# Patient Record
Sex: Male | Born: 1945 | Race: White | Hispanic: No | Marital: Married | Smoking: Current every day smoker
Health system: Southern US, Community
[De-identification: ages and names within clinical notes are randomized; demographics above are authoritative.]

## PROBLEM LIST (undated history)

## (undated) DIAGNOSIS — K219 Gastro-esophageal reflux disease without esophagitis: Secondary | ICD-10-CM

## (undated) DIAGNOSIS — H409 Unspecified glaucoma: Secondary | ICD-10-CM

## (undated) DIAGNOSIS — J449 Chronic obstructive pulmonary disease, unspecified: Secondary | ICD-10-CM

## (undated) DIAGNOSIS — K922 Gastrointestinal hemorrhage, unspecified: Secondary | ICD-10-CM

---

## 2011-03-24 DIAGNOSIS — H269 Unspecified cataract: Secondary | ICD-10-CM

## 2011-03-24 HISTORY — DX: Unspecified cataract: H26.9

## 2011-07-13 DIAGNOSIS — H524 Presbyopia: Secondary | ICD-10-CM | POA: Diagnosis not present

## 2011-07-13 DIAGNOSIS — H40019 Open angle with borderline findings, low risk, unspecified eye: Secondary | ICD-10-CM | POA: Diagnosis not present

## 2011-07-13 DIAGNOSIS — H2589 Other age-related cataract: Secondary | ICD-10-CM | POA: Diagnosis not present

## 2011-08-13 DIAGNOSIS — IMO0002 Reserved for concepts with insufficient information to code with codable children: Secondary | ICD-10-CM | POA: Diagnosis not present

## 2011-08-13 DIAGNOSIS — H25049 Posterior subcapsular polar age-related cataract, unspecified eye: Secondary | ICD-10-CM | POA: Diagnosis not present

## 2011-08-13 DIAGNOSIS — H2510 Age-related nuclear cataract, unspecified eye: Secondary | ICD-10-CM | POA: Diagnosis not present

## 2011-08-13 DIAGNOSIS — H53469 Homonymous bilateral field defects, unspecified side: Secondary | ICD-10-CM | POA: Diagnosis not present

## 2011-08-24 ENCOUNTER — Encounter (HOSPITAL_COMMUNITY): Payer: Self-pay | Admitting: Pharmacy Technician

## 2011-08-24 DIAGNOSIS — H40019 Open angle with borderline findings, low risk, unspecified eye: Secondary | ICD-10-CM | POA: Diagnosis not present

## 2011-08-24 DIAGNOSIS — H2589 Other age-related cataract: Secondary | ICD-10-CM | POA: Diagnosis not present

## 2011-08-26 ENCOUNTER — Encounter (HOSPITAL_COMMUNITY)
Admission: RE | Admit: 2011-08-26 | Discharge: 2011-08-26 | Disposition: A | Discharge status: Home / Self Care | Payer: Medicare Other | Source: Ambulatory Visit | Attending: Ophthalmology | Admitting: Ophthalmology

## 2011-08-26 ENCOUNTER — Encounter (HOSPITAL_COMMUNITY): Payer: Self-pay

## 2011-08-26 DIAGNOSIS — J449 Chronic obstructive pulmonary disease, unspecified: Secondary | ICD-10-CM | POA: Diagnosis not present

## 2011-08-26 DIAGNOSIS — Z01812 Encounter for preprocedural laboratory examination: Secondary | ICD-10-CM | POA: Diagnosis not present

## 2011-08-26 DIAGNOSIS — H2589 Other age-related cataract: Secondary | ICD-10-CM | POA: Diagnosis not present

## 2011-08-26 DIAGNOSIS — Z01810 Encounter for preprocedural cardiovascular examination: Secondary | ICD-10-CM | POA: Diagnosis not present

## 2011-08-26 HISTORY — DX: Chronic obstructive pulmonary disease, unspecified: J44.9

## 2011-08-26 HISTORY — DX: Unspecified glaucoma: H40.9

## 2011-08-26 LAB — BASIC METABOLIC PANEL
BUN: 5 mg/dL — ABNORMAL LOW (ref 6–23)
Calcium: 9.8 mg/dL (ref 8.4–10.5)
Chloride: 97 mEq/L (ref 96–112)
Creatinine, Ser: 0.87 mg/dL (ref 0.50–1.35)
GFR calc Af Amer: >90 mL/min (ref >90)

## 2011-08-26 NOTE — Patient Instructions (Addendum)
Your procedure is scheduled on: Monday, 08/31/11   Report to [facility] at     0700   AM.  Call this number if you have problems the morning of surgery: [phone]   Remember:   Do not eat or drink   After Midnight.  Take these medicines the morning of surgery with A SIP OF WATER: none   Do not wear jewelry, make-up or nail polish.  Do not wear lotions, powders, or perfumes. You may wear deodorant.  Do not bring valuables to the hospital.  Contacts, dentures or bridgework may not be worn into surgery.     Patients discharged the day of surgery will not be allowed to drive home.  Name and phone number of your driver: family  Special Instructions: Use eye drops as directed.   Please read over the following fact sheets that you were given: Pain Booklet, Anesthesia Post-op Instructions and Care and Recovery After Surgery    Cataract Surgery  A cataract is a clouding of the lens of the eye. When a lens becomes cloudy, vision is reduced based on the degree and nature of the clouding. Surgery may be needed to improve vision. Surgery removes the cloudy lens and usually replaces it with a substitute lens (intraocular lens, IOL). LET YOUR EYE DOCTOR KNOW ABOUT:  Allergies to food or medicine.   Medicines taken including herbs, eyedrops, over-the-counter medicines, and creams.   Use of steroids (by mouth or creams).   Previous problems with anesthetics or numbing medicine.   History of bleeding problems or blood clots.   Previous surgery.   Other health problems, including diabetes and kidney problems.   Possibility of pregnancy, if this applies.  RISKS AND COMPLICATIONS  Infection.   Inflammation of the eyeball (endophthalmitis) that can spread to both eyes (sympathetic ophthalmia).   Poor wound healing.   If an IOL is inserted, it can later fall out of proper position. This is very uncommon.   Clouding of the part of your eye that holds an IOL in place. This is called an  "after-cataract." These are uncommon, but easily treated.  BEFORE THE PROCEDURE  Do not eat or drink anything except small amounts of water for 8 to 12 before your surgery, or as directed by your caregiver.   Unless you are told otherwise, continue any eyedrops you have been prescribed.   Talk to your primary caregiver about all other medicines that you take (both prescription and non-prescription). In some cases, you may need to stop or change medicines near the time of your surgery. This is most important if you are taking blood-thinning medicine.Do not stop medicines unless you are told to do so.   Arrange for someone to drive you to and from the procedure.   Do not put contact lenses in either eye on the day of your surgery.  PROCEDURE There is more than one method for safely removing a cataract. Your doctor can explain the differences and help determine which is best for you. Phacoemulsification surgery is the most common form of cataract surgery.  An injection is given behind the eye or eyedrops are given to make this a painless procedure.   A small cut (incision) is made on the edge of the clear, dome-shaped surface that covers the front of the eye (cornea).   A tiny probe is painlessly inserted into the eye. This device gives off ultrasound waves that soften and break up the cloudy center of the lens. This makes it  easier for the cloudy lens to be removed by suction.   An IOL may be implanted.   The normal lens of the eye is covered by a clear capsule. Part of that capsule is intentionally left in the eye to support the IOL.   Your surgeon may or may not use stitches to close the incision.  There are other forms of cataract surgery that require a larger incision and stiches to close the eye. This approach is taken in cases where the doctor feels that the cataract cannot be easily removed using phacoemulsification. AFTER THE PROCEDURE  When an IOL is implanted, it does not need  care. It becomes a permanent part of your eye and cannot be seen or felt.   Your doctor will schedule follow-up exams to check on your progress.   Review your other medicines with your doctor to see which can be resumed after surgery.   Use eyedrops or take medicine as prescribed by your doctor.  Document Released: 02/26/2011 Document Reviewed: 02/23/2011 [facility] Patient Information 2012 [place], [place].  PATIENT INSTRUCTIONS POST-ANESTHESIA  IMMEDIATELY FOLLOWING SURGERY:  Do not drive or operate machinery for the first twenty four hours after surgery.  Do not make any important decisions for twenty four hours after surgery or while taking narcotic pain medications or sedatives.  If you develop intractable nausea and vomiting or a severe headache please notify your doctor immediately.  FOLLOW-UP:  Please make an appointment with your surgeon as instructed. You do not need to follow up with anesthesia unless specifically instructed to do so.  WOUND CARE INSTRUCTIONS (if applicable):  Keep a dry clean dressing on the anesthesia/puncture wound site if there is drainage.  Once the wound has quit draining you may leave it open to air.  Generally you should leave the bandage intact for twenty four hours unless there is drainage.  If the epidural site drains for more than 36-48 hours please call the anesthesia department.  QUESTIONS?:  Please feel free to call your physician or the hospital operator if you have any questions, and they will be happy to assist you.

## 2011-08-28 MED ORDER — TETRACAINE HCL 0.5 % OP SOLN
OPHTHALMIC | Status: AC
  Filled 2011-08-28: qty 2

## 2011-08-28 MED ORDER — CYCLOPENTOLATE-PHENYLEPHRINE 0.2-1 % OP SOLN
OPHTHALMIC | Status: AC
  Filled 2011-08-28: qty 2

## 2011-08-28 MED ORDER — NEOMYCIN-POLYMYXIN-DEXAMETH 3.5-10000-0.1 OP OINT
TOPICAL_OINTMENT | OPHTHALMIC | Status: AC
  Filled 2011-08-28: qty 3.5

## 2011-08-28 MED ORDER — LIDOCAINE HCL (PF) 1 % IJ SOLN
INTRAMUSCULAR | Status: AC
  Filled 2011-08-28: qty 2

## 2011-08-28 MED ORDER — PHENYLEPHRINE HCL 2.5 % OP SOLN
OPHTHALMIC | Status: AC
  Filled 2011-08-28: qty 2

## 2011-08-28 MED ORDER — LIDOCAINE HCL 3.5 % OP GEL
OPHTHALMIC | Status: AC
  Filled 2011-08-28: qty 5

## 2011-08-28 NOTE — OR Nursing (Signed)
Potassium 5.2 reported to Dr. Jayme Cloud, no further orders.

## 2011-08-31 ENCOUNTER — Ambulatory Visit (HOSPITAL_COMMUNITY)
Admission: RE | Admit: 2011-08-31 | Discharge: 2011-08-31 | Disposition: A | Discharge status: Home / Self Care | Payer: Medicare Other | Source: Ambulatory Visit | Attending: Ophthalmology | Admitting: Ophthalmology

## 2011-08-31 ENCOUNTER — Encounter (HOSPITAL_COMMUNITY): Payer: Self-pay | Admitting: *Deleted

## 2011-08-31 ENCOUNTER — Encounter (HOSPITAL_COMMUNITY): Payer: Self-pay | Admitting: Anesthesiology

## 2011-08-31 ENCOUNTER — Ambulatory Visit (HOSPITAL_COMMUNITY): Payer: Medicare Other | Admitting: Anesthesiology

## 2011-08-31 ENCOUNTER — Encounter (HOSPITAL_COMMUNITY)
Admission: RE | Disposition: A | Discharge status: Home / Self Care | Payer: Self-pay | Source: Ambulatory Visit | Attending: Ophthalmology

## 2011-08-31 DIAGNOSIS — J449 Chronic obstructive pulmonary disease, unspecified: Secondary | ICD-10-CM | POA: Diagnosis not present

## 2011-08-31 DIAGNOSIS — Z01810 Encounter for preprocedural cardiovascular examination: Secondary | ICD-10-CM | POA: Insufficient documentation

## 2011-08-31 DIAGNOSIS — H269 Unspecified cataract: Secondary | ICD-10-CM | POA: Diagnosis not present

## 2011-08-31 DIAGNOSIS — H2589 Other age-related cataract: Secondary | ICD-10-CM | POA: Diagnosis not present

## 2011-08-31 DIAGNOSIS — Z01812 Encounter for preprocedural laboratory examination: Secondary | ICD-10-CM | POA: Insufficient documentation

## 2011-08-31 DIAGNOSIS — J4489 Other specified chronic obstructive pulmonary disease: Secondary | ICD-10-CM | POA: Insufficient documentation

## 2011-08-31 HISTORY — PX: CATARACT EXTRACTION W/PHACO: SHX586

## 2011-08-31 SURGERY — PHACOEMULSIFICATION, CATARACT, WITH IOL INSERTION
Anesthesia: Monitor Anesthesia Care | Site: Eye | Laterality: Left | Wound class: Clean

## 2011-08-31 MED ORDER — TETRACAINE HCL 0.5 % OP SOLN
1.0000 [drp] | OPHTHALMIC | Status: AC
  Administered 2011-08-31 (×3): 1 [drp] via OPHTHALMIC

## 2011-08-31 MED ORDER — NEOMYCIN-POLYMYXIN-DEXAMETH 0.1 % OP OINT
TOPICAL_OINTMENT | OPHTHALMIC | Status: DC | PRN
  Administered 2011-08-31: 1 via OPHTHALMIC

## 2011-08-31 MED ORDER — LIDOCAINE HCL (PF) 1 % IJ SOLN
INTRAMUSCULAR | Status: DC | PRN
  Administered 2011-08-31: .6 mL

## 2011-08-31 MED ORDER — MIDAZOLAM HCL 2 MG/2ML IJ SOLN
INTRAMUSCULAR | Status: AC
  Filled 2011-08-31: qty 2

## 2011-08-31 MED ORDER — EPINEPHRINE HCL 1 MG/ML IJ SOLN
INTRAMUSCULAR | Status: AC
  Filled 2011-08-31: qty 1

## 2011-08-31 MED ORDER — BSS IO SOLN
INTRAOCULAR | Status: DC | PRN
  Administered 2011-08-31: 15 mL via INTRAOCULAR

## 2011-08-31 MED ORDER — POVIDONE-IODINE 5 % OP SOLN
OPHTHALMIC | Status: DC | PRN
  Administered 2011-08-31: 1 via OPHTHALMIC

## 2011-08-31 MED ORDER — PHENYLEPHRINE HCL 2.5 % OP SOLN
1.0000 [drp] | OPHTHALMIC | Status: AC
  Administered 2011-08-31 (×3): 1 [drp] via OPHTHALMIC

## 2011-08-31 MED ORDER — LACTATED RINGERS IV SOLN
INTRAVENOUS | Status: DC
  Administered 2011-08-31: 08:00:00 via INTRAVENOUS

## 2011-08-31 MED ORDER — MIDAZOLAM HCL 2 MG/2ML IJ SOLN
1.0000 mg | INTRAMUSCULAR | Status: DC | PRN
Start: 2011-08-31 — End: 2011-08-31
  Administered 2011-08-31: 2 mg via INTRAVENOUS

## 2011-08-31 MED ORDER — CYCLOPENTOLATE-PHENYLEPHRINE 0.2-1 % OP SOLN
1.0000 [drp] | OPHTHALMIC | Status: AC
  Administered 2011-08-31 (×3): 1 [drp] via OPHTHALMIC

## 2011-08-31 MED ORDER — LIDOCAINE 3.5 % OP GEL OPTIME - NO CHARGE
OPHTHALMIC | Status: DC | PRN
  Administered 2011-08-31: 1 [drp] via OPHTHALMIC

## 2011-08-31 MED ORDER — LIDOCAINE HCL 3.5 % OP GEL: 1.0000 "application " | Freq: Once | OPHTHALMIC | Status: DC

## 2011-08-31 MED ORDER — EPINEPHRINE HCL 1 MG/ML IJ SOLN
INTRAOCULAR | Status: DC | PRN
  Administered 2011-08-31: 08:00:00

## 2011-08-31 MED ORDER — PROVISC 10 MG/ML IO SOLN
INTRAOCULAR | Status: DC | PRN
  Administered 2011-08-31: 8.5 mg via INTRAOCULAR

## 2011-08-31 SURGICAL SUPPLY — 32 items

## 2011-08-31 NOTE — Discharge Instructions (Signed)
Thomas Davenport  08/31/2011     Instructions  1. Use medications as Instructed.  Shake well before use. Wait 5 minutes between drops.  2. Do not rub the operative eye. Do not swim underwater for 2 weeks.  3. You may remove the clear shield and resume your normal activities the day after  Surgery. Your eyes may feel more comfortable if you wear dark glasses outside.  4. Call our office at [phone] if you have sudden change in vision, extreme redness or pain. Some fluctuation in vision is normal after surgery. If you have an emergency after hours, call Dr. Alto Denver at [phone].  5. It is important that you attend all of your follow-up appointments.        Follow-up:{follow up:32580} with Gemma Payor, MD.   Dr. Lahoma Crocker: [phone]  Dr. Lita Mains: [phone]  Dr. Alto Denver: [phone]   If you find that you cannot contact your physician, but feel that your signs and   Symptoms warrant a physician's attention, call the Emergency Room at   [phone].    PATIENT INSTRUCTIONS POST-ANESTHESIA  IMMEDIATELY FOLLOWING SURGERY:  Do not drive or operate machinery for the first twenty four hours after surgery.  Do not make any important decisions for twenty four hours after surgery or while taking narcotic pain medications or sedatives.  If you develop intractable nausea and vomiting or a severe headache please notify your doctor immediately.  FOLLOW-UP:  Please make an appointment with your surgeon as instructed. You do not need to follow up with anesthesia unless specifically instructed to do so.  WOUND CARE INSTRUCTIONS (if applicable):  Keep a dry clean dressing on the anesthesia/puncture wound site if there is drainage.  Once the wound has quit draining you may leave it open to air.  Generally you should leave the bandage intact for twenty four hours unless there is drainage.  If the epidural site drains for more than 36-48 hours please call the anesthesia department.  QUESTIONS?:  Please  feel free to call your physician or the hospital operator if you have any questions, and they will be happy to assist you.

## 2011-08-31 NOTE — Op Note (Signed)
NAMESTIRLING, ORTON                ACCOUNT NO.:  [id number]  MEDICAL RECORD NO.:  [id number]  LOCATION:  [facility]                          FACILITY:  [facility]  PHYSICIAN:  Susanne Greenhouse, MD       DATE OF BIRTH:  [birth year]  DATE OF PROCEDURE:  08/31/2011 DATE OF DISCHARGE:  08/31/2011                              OPERATIVE REPORT   PREOPERATIVE DIAGNOSIS:  Combined cataract, left eye, diagnosis code 366.19.  POSTOPERATIVE DIAGNOSIS:  Combined cataract, left eye, diagnosis code 366.19.  OPERATION PERFORMED:  Phacoemulsification with posterior chamber intraocular lens implantation, left eye.  SURGEON:  Bonne Dolores. Gretna Bergin, MD  ANESTHESIA:  Topical wit IV sedation  OPERATIVE SUMMARY:  In the preoperative area, dilating drops were placed into the left eye.  The patient was then brought into the operating room where he was placed under general anesthesia.  The eye was then prepped and draped.  Beginning with a 75 blade, a paracentesis port was made at the surgeon's 2 o'clock position.  The anterior chamber was then filled with a 1% nonpreserved lidocaine solution with epinephrine.  This was followed by Viscoat to deepen the chamber.  A small fornix-based peritomy was performed superiorly.  Next, a single iris hook was placed through the limbus superiorly.  A 2.4-mm keratome blade was then used to make a clear corneal incision over the iris hook.  A bent cystotome needle and Utrata forceps were used to create a continuous tear capsulotomy.  Hydrodissection was performed using balanced salt solution on a fine cannula.  The lens nucleus was then removed using phacoemulsification in a quadrant cracking technique.  The cortical material was then removed with irrigation and aspiration.  The capsular bag and anterior chamber were refilled with Provisc.  The wound was widened to approximately 3 mm and a posterior chamber intraocular lens was placed into the capsular bag without difficulty using an  [employer] lens injecting system.  A single 10-0 nylon suture was then used to close the incision as well as stromal hydration.  The Provisc was removed from the anterior chamber and capsular bag with irrigation and aspiration.  At this point, the wounds were tested for leak, which were negative.  The anterior chamber remained deep and stable.  The patient tolerated the procedure well.  There were no operative complications, and he awoke from general anesthesia without problem.  No surgical specimens.  Prosthetic device used is a Lenstec posterior chamber lens, model Softec HD, power of 23.5, serial number is [id number].          ______________________________ Susanne Greenhouse, MD     KEH/MEDQ  D:  08/31/2011  T:  08/31/2011  Job:  [id number]

## 2011-08-31 NOTE — Brief Op Note (Signed)
Pre-Op Dx: Cataract OS Post-Op Dx: Cataract OS Surgeon: Yaelis Scharfenberg Anesthesia: Topical with MAC Surgery: Cataract Extraction with Intraocular lens Implant OS Implant: Lenstec, Model Softec HD Specimen: None Complications: None 

## 2011-08-31 NOTE — Transfer of Care (Signed)
Immediate Anesthesia Transfer of Care Note  Patient: Thomas Davenport  Procedure(s) Performed: Procedure(s) (LRB): CATARACT EXTRACTION PHACO AND INTRAOCULAR LENS PLACEMENT (IOC) (Left)  Patient Location: Shortstay  Anesthesia Type: MAC  Level of Consciousness: awake  Airway & Oxygen Therapy: Patient Spontanous Breathing   Post-op Assessment: Report given to PACU RN, Post -op Vital signs reviewed and stable and Patient moving all extremities  Post vital signs: Reviewed and stable  Complications: No apparent anesthesia complications

## 2011-08-31 NOTE — Anesthesia Procedure Notes (Signed)
Procedure Name: MAC Date/Time: 08/31/2011 8:23 AM Performed by: Franco Nones Pre-anesthesia Checklist: Patient identified, Emergency Drugs available, Suction available, Timeout performed and Patient being monitored Patient Re-evaluated:Patient Re-evaluated prior to inductionOxygen Delivery Method: Nasal Cannula

## 2011-08-31 NOTE — H&P (Signed)
I have reviewed the H&P, the patient was re-examined, and I have identified no interval changes in medical condition and plan of care since the history and physical of record  

## 2011-08-31 NOTE — Anesthesia Postprocedure Evaluation (Signed)
  Anesthesia Post-op Note  Patient: Thomas Davenport  Procedure(s) Performed: Procedure(s) (LRB): CATARACT EXTRACTION PHACO AND INTRAOCULAR LENS PLACEMENT (IOC) (Left)  Patient Location:  Short Stay  Anesthesia Type: MAC  Level of Consciousness: awake  Airway and Oxygen Therapy: Patient Spontanous Breathing  Post-op Pain: none  Post-op Assessment: Post-op Vital signs reviewed, Patient's Cardiovascular Status Stable, Respiratory Function Stable, Patent Airway, No signs of Nausea or vomiting and Pain level controlled  Post-op Vital Signs: Reviewed and stable  Complications: No apparent anesthesia complications

## 2011-08-31 NOTE — Anesthesia Preprocedure Evaluation (Signed)
Anesthesia Evaluation  Patient identified by MRN, date of birth, ID band Patient awake    Reviewed: Allergy & Precautions, H&P , NPO status , Patient's Chart, lab work & pertinent test results  Airway Mallampati: I      Dental  (+) Edentulous Upper and Edentulous Lower   Pulmonary COPD breath sounds clear to auscultation        Cardiovascular negative cardio ROS  Rhythm:Regular Rate:Normal     Neuro/Psych    GI/Hepatic negative GI ROS,   Endo/Other    Renal/GU      Musculoskeletal   Abdominal   Peds  Hematology   Anesthesia Other Findings   Reproductive/Obstetrics                           Anesthesia Physical Anesthesia Plan  ASA: II  Anesthesia Plan: MAC   Post-op Pain Management:    Induction: Intravenous  Airway Management Planned: Nasal Cannula  Additional Equipment:   Intra-op Plan:   Post-operative Plan:   Informed Consent: I have reviewed the patients History and Physical, chart, labs and discussed the procedure including the risks, benefits and alternatives for the proposed anesthesia with the patient or authorized representative who has indicated his/her understanding and acceptance.     Plan Discussed with:   Anesthesia Plan Comments:         Anesthesia Quick Evaluation  

## 2011-09-01 ENCOUNTER — Encounter (HOSPITAL_COMMUNITY): Payer: Self-pay | Admitting: Ophthalmology

## 2011-09-07 DIAGNOSIS — H2589 Other age-related cataract: Secondary | ICD-10-CM | POA: Diagnosis not present

## 2011-09-07 DIAGNOSIS — H5231 Anisometropia: Secondary | ICD-10-CM | POA: Diagnosis not present

## 2011-09-08 ENCOUNTER — Encounter (HOSPITAL_COMMUNITY)
Admission: RE | Admit: 2011-09-08 | Discharge: 2011-09-08 | Payer: Medicare Other | Source: Ambulatory Visit | Attending: Ophthalmology | Admitting: Ophthalmology

## 2011-09-08 ENCOUNTER — Encounter (HOSPITAL_COMMUNITY): Payer: Self-pay

## 2011-09-11 MED ORDER — CYCLOPENTOLATE-PHENYLEPHRINE 0.2-1 % OP SOLN
OPHTHALMIC | Status: AC
  Filled 2011-09-11: qty 2

## 2011-09-11 MED ORDER — LIDOCAINE HCL 3.5 % OP GEL
OPHTHALMIC | Status: AC
  Filled 2011-09-11: qty 5

## 2011-09-11 MED ORDER — NEOMYCIN-POLYMYXIN-DEXAMETH 3.5-10000-0.1 OP OINT
TOPICAL_OINTMENT | OPHTHALMIC | Status: AC
  Filled 2011-09-11: qty 3.5

## 2011-09-11 MED ORDER — LIDOCAINE HCL (PF) 1 % IJ SOLN
INTRAMUSCULAR | Status: AC
  Filled 2011-09-11: qty 2

## 2011-09-11 MED ORDER — TETRACAINE HCL 0.5 % OP SOLN
OPHTHALMIC | Status: AC
  Filled 2011-09-11: qty 2

## 2011-09-14 ENCOUNTER — Encounter (HOSPITAL_COMMUNITY): Payer: Self-pay | Admitting: Anesthesiology

## 2011-09-14 ENCOUNTER — Ambulatory Visit (HOSPITAL_COMMUNITY): Payer: Medicare Other | Admitting: Anesthesiology

## 2011-09-14 ENCOUNTER — Encounter (HOSPITAL_COMMUNITY)
Admission: RE | Disposition: A | Discharge status: Home / Self Care | Payer: Self-pay | Source: Ambulatory Visit | Attending: Ophthalmology

## 2011-09-14 ENCOUNTER — Encounter (HOSPITAL_COMMUNITY): Payer: Self-pay | Admitting: *Deleted

## 2011-09-14 ENCOUNTER — Ambulatory Visit (HOSPITAL_COMMUNITY)
Admission: RE | Admit: 2011-09-14 | Discharge: 2011-09-14 | Disposition: A | Discharge status: Home / Self Care | Payer: Medicare Other | Source: Ambulatory Visit | Attending: Ophthalmology | Admitting: Ophthalmology

## 2011-09-14 DIAGNOSIS — J4489 Other specified chronic obstructive pulmonary disease: Secondary | ICD-10-CM | POA: Insufficient documentation

## 2011-09-14 DIAGNOSIS — H2589 Other age-related cataract: Secondary | ICD-10-CM | POA: Insufficient documentation

## 2011-09-14 DIAGNOSIS — J449 Chronic obstructive pulmonary disease, unspecified: Secondary | ICD-10-CM | POA: Diagnosis not present

## 2011-09-14 DIAGNOSIS — H269 Unspecified cataract: Secondary | ICD-10-CM | POA: Diagnosis not present

## 2011-09-14 HISTORY — PX: CATARACT EXTRACTION W/PHACO: SHX586

## 2011-09-14 SURGERY — PHACOEMULSIFICATION, CATARACT, WITH IOL INSERTION
Anesthesia: Monitor Anesthesia Care | Site: Eye | Laterality: Right | Wound class: Clean

## 2011-09-14 MED ORDER — POVIDONE-IODINE 5 % OP SOLN
OPHTHALMIC | Status: DC | PRN
  Administered 2011-09-14: 1 via OPHTHALMIC

## 2011-09-14 MED ORDER — EPINEPHRINE HCL 1 MG/ML IJ SOLN
INTRAOCULAR | Status: DC | PRN
  Administered 2011-09-14: 12:00:00

## 2011-09-14 MED ORDER — LIDOCAINE HCL 3.5 % OP GEL: 1.0000 "application " | Freq: Once | OPHTHALMIC | Status: DC

## 2011-09-14 MED ORDER — TETRACAINE HCL 0.5 % OP SOLN
OPHTHALMIC | Status: AC
  Filled 2011-09-14: qty 2

## 2011-09-14 MED ORDER — PHENYLEPHRINE HCL 2.5 % OP SOLN
1.0000 [drp] | OPHTHALMIC | Status: AC
  Administered 2011-09-14: 1 [drp] via OPHTHALMIC
  Administered 2011-09-14: 12:00:00 via OPHTHALMIC
  Administered 2011-09-14: 1 [drp] via OPHTHALMIC

## 2011-09-14 MED ORDER — TETRACAINE HCL 0.5 % OP SOLN
1.0000 [drp] | OPHTHALMIC | Status: AC
  Administered 2011-09-14: 1 [drp] via OPHTHALMIC
  Administered 2011-09-14: 12:00:00 via OPHTHALMIC
  Administered 2011-09-14: 1 [drp] via OPHTHALMIC

## 2011-09-14 MED ORDER — EPINEPHRINE HCL 1 MG/ML IJ SOLN
INTRAMUSCULAR | Status: AC
  Filled 2011-09-14: qty 1

## 2011-09-14 MED ORDER — LACTATED RINGERS IV SOLN
INTRAVENOUS | Status: DC
  Administered 2011-09-14: 1000 mL via INTRAVENOUS

## 2011-09-14 MED ORDER — NEOMYCIN-POLYMYXIN-DEXAMETH 0.1 % OP OINT
TOPICAL_OINTMENT | OPHTHALMIC | Status: DC | PRN
  Administered 2011-09-14: 1 via OPHTHALMIC

## 2011-09-14 MED ORDER — CYCLOPENTOLATE-PHENYLEPHRINE 0.2-1 % OP SOLN
1.0000 [drp] | OPHTHALMIC | Status: AC
  Administered 2011-09-14 (×3): 1 [drp] via OPHTHALMIC

## 2011-09-14 MED ORDER — MIDAZOLAM HCL 2 MG/2ML IJ SOLN
INTRAMUSCULAR | Status: AC
  Administered 2011-09-14: 2 mg via INTRAVENOUS
  Filled 2011-09-14: qty 2

## 2011-09-14 MED ORDER — CYCLOPENTOLATE-PHENYLEPHRINE 0.2-1 % OP SOLN: 1.0000 [drp] | OPHTHALMIC | Status: AC

## 2011-09-14 MED ORDER — MIDAZOLAM HCL 2 MG/2ML IJ SOLN
INTRAMUSCULAR | Status: AC
  Filled 2011-09-14: qty 2

## 2011-09-14 MED ORDER — MIDAZOLAM HCL 2 MG/2ML IJ SOLN
1.0000 mg | INTRAMUSCULAR | Status: DC | PRN
  Administered 2011-09-14: 2 mg via INTRAVENOUS

## 2011-09-14 MED ORDER — BSS IO SOLN
INTRAOCULAR | Status: DC | PRN
  Administered 2011-09-14: 15 mL via INTRAOCULAR

## 2011-09-14 MED ORDER — PHENYLEPHRINE HCL 2.5 % OP SOLN
OPHTHALMIC | Status: AC
  Filled 2011-09-14: qty 2

## 2011-09-14 MED ORDER — PROVISC 10 MG/ML IO SOLN
INTRAOCULAR | Status: DC | PRN
  Administered 2011-09-14: 8.5 mg via INTRAOCULAR

## 2011-09-14 MED ORDER — LIDOCAINE 3.5 % OP GEL OPTIME - NO CHARGE
OPHTHALMIC | Status: DC | PRN
  Administered 2011-09-14: 1 [drp] via OPHTHALMIC

## 2011-09-14 MED ORDER — LIDOCAINE HCL (PF) 1 % IJ SOLN
INTRAMUSCULAR | Status: DC | PRN
  Administered 2011-09-14: .5 mL

## 2011-09-14 SURGICAL SUPPLY — 32 items

## 2011-09-14 NOTE — Op Note (Signed)
Thomas Davenport, Thomas Davenport                ACCOUNT NO.:  [id number]  MEDICAL RECORD NO.:  [id number]  LOCATION:  [facility]                          FACILITY:  [facility]  PHYSICIAN:  Susanne Greenhouse, MD       DATE OF BIRTH:  1945-10-14  DATE OF PROCEDURE:  09/14/2011 DATE OF DISCHARGE:  09/14/2011                              OPERATIVE REPORT   PREOPERATIVE DIAGNOSIS:  Combined cataract, right eye, diagnosis code 366.19.  POSTOPERATIVE DIAGNOSIS:  Combined cataract, right eye, diagnosis code 366.19.  OPERATION PERFORMED:  Phacoemulsification with posterior chamber intraocular lens implantation, right eye.  SURGEON:  Bonne Dolores. Clora Ohmer, MD  ANESTHESIA:  Topical with IV sedation.  OPERATIVE SUMMARY:  In the preoperative area, dilating drops were placed into the right eye.  The patient was then brought into the operating room where he was placed under general anesthesia.  The eye was then prepped and draped.  Beginning with a 75 blade, a paracentesis port was made at the surgeon's 2 o'clock position.  The anterior chamber was then filled with a 1% nonpreserved lidocaine solution with epinephrine.  This was followed by Viscoat to deepen the chamber.  A small fornix-based peritomy was performed superiorly.  Next, a single iris hook was placed through the limbus superiorly.  A 2.4-mm keratome blade was then used to make a clear corneal incision over the iris hook.  A bent cystotome needle and Utrata forceps were used to create a continuous tear capsulotomy.  Hydrodissection was performed using balanced salt solution on a fine cannula.  The lens nucleus was then removed using phacoemulsification in a quadrant cracking technique.  The cortical material was then removed with irrigation and aspiration.  The capsular bag and anterior chamber were refilled with Provisc.  The wound was widened to approximately 3 mm and a posterior chamber intraocular lens was placed into the capsular bag without difficulty using an  [employer] lens injecting system.  A single 10-0 nylon suture was then used to close the incision as well as stromal hydration.  The Provisc was removed from the anterior chamber and capsular bag with irrigation and aspiration.  At this point, the wounds were tested for leak, which were negative.  The anterior chamber remained deep and stable.  The patient tolerated the procedure well.  There were no operative complications, and he awoke from general anesthesia without problem.  Torque intraocular lens was oriented at the 179 and 359 degrees meridians.  No surgical specimens. Prosthetic device used is a Lenstec posterior chamber lens, model Softec HD, power of 22.25, serial number is [id number].          ______________________________ Susanne Greenhouse, MD     KEH/MEDQ  D:  09/14/2011  T:  09/14/2011  Job:  [id number]

## 2011-09-14 NOTE — H&P (Signed)
I have reviewed the H&P, the patient was re-examined, and I have identified no interval changes in medical condition and plan of care since the history and physical of record  

## 2011-09-14 NOTE — Anesthesia Procedure Notes (Signed)
Procedure Name: MAC Date/Time: 09/14/2011 12:23 PM Performed by: Franco Nones Pre-anesthesia Checklist: Patient identified, Emergency Drugs available, Suction available, Timeout performed and Patient being monitored Patient Re-evaluated:Patient Re-evaluated prior to inductionOxygen Delivery Method: Nasal Cannula

## 2011-09-14 NOTE — Discharge Instructions (Signed)
Thomas Davenport  09/14/2011     Instructions  1. Use medications as Instructed.  Shake well before use. Wait 5 minutes between drops       .  2. Do not rub the operative eye. Do not swim underwater for 2 weeks.  3. You may remove the clear shield and resume your normal activities the day after  Surgery. Your eyes may feel more comfortable if you wear dark glasses outside.  4. Call our office at [phone] if you have sudden change in vision, extreme redness or pain. Some fluctuation in vision is normal after surgery. If you have an emergency after hours, call Dr. Alto Denver at [phone].  5. It is important that you attend all of your follow-up appointments.        Follow-up:{follow up:32580} with Gemma Payor, MD.   Dr. Lahoma Crocker: [phone]  Dr. Lita Mains: [phone]  Dr. Alto Denver: [phone]   If you find that you cannot contact your physician, but feel that your signs and   Symptoms warrant a physician's attention, call the Emergency Room at   [phone].   Other{NA AND [id number]}.

## 2011-09-14 NOTE — Brief Op Note (Signed)
Pre-Op Dx: Cataract OD Post-Op Dx: Cataract OD Surgeon: Zyron Deeley Anesthesia: Topical with MAC Surgery: Cataract Extraction with Intraocular lens Implant OD Implant: Lenstec, Model Softec HD Blood Loss: None Specimen: None Complications: None 

## 2011-09-14 NOTE — Transfer of Care (Signed)
Immediate Anesthesia Transfer of Care Note  Patient: Thomas Davenport  Procedure(s) Performed: Procedure(s) (LRB): CATARACT EXTRACTION PHACO AND INTRAOCULAR LENS PLACEMENT (IOC) (Right)  Patient Location: PACU and Short Stay  Anesthesia Type: MAC  Level of Consciousness: awake, alert  and oriented  Airway & Oxygen Therapy: Patient Spontanous Breathing  Post-op Assessment: Report given to PACU RN  Post vital signs: Reviewed and stable  Complications: No apparent anesthesia complications

## 2011-09-14 NOTE — Anesthesia Preprocedure Evaluation (Signed)
Anesthesia Evaluation  Patient identified by MRN, date of birth, ID band Patient awake    Reviewed: Allergy & Precautions, H&P , NPO status , Patient's Chart, lab work & pertinent test results  Airway Mallampati: I      Dental  (+) Edentulous Upper and Edentulous Lower   Pulmonary COPD breath sounds clear to auscultation        Cardiovascular negative cardio ROS  Rhythm:Regular Rate:Normal     Neuro/Psych    GI/Hepatic negative GI ROS,   Endo/Other    Renal/GU      Musculoskeletal   Abdominal   Peds  Hematology   Anesthesia Other Findings   Reproductive/Obstetrics                           Anesthesia Physical Anesthesia Plan  ASA: II  Anesthesia Plan: MAC   Post-op Pain Management:    Induction: Intravenous  Airway Management Planned: Nasal Cannula  Additional Equipment:   Intra-op Plan:   Post-operative Plan:   Informed Consent: I have reviewed the patients History and Physical, chart, labs and discussed the procedure including the risks, benefits and alternatives for the proposed anesthesia with the patient or authorized representative who has indicated his/her understanding and acceptance.     Plan Discussed with:   Anesthesia Plan Comments:         Anesthesia Quick Evaluation

## 2011-09-14 NOTE — Anesthesia Postprocedure Evaluation (Signed)
  Anesthesia Post-op Note  Patient: Thomas Davenport  Procedure(s) Performed: Procedure(s) (LRB): CATARACT EXTRACTION PHACO AND INTRAOCULAR LENS PLACEMENT (IOC) (Right)  Patient Location: PACU and Short Stay  Anesthesia Type: MAC  Level of Consciousness: awake, alert  and oriented  Airway and Oxygen Therapy: Patient Spontanous Breathing  Post-op Pain: none  Post-op Assessment: Post-op Vital signs reviewed, Patient's Cardiovascular Status Stable, Respiratory Function Stable, Patent Airway and No signs of Nausea or vomiting  Post-op Vital Signs: Reviewed and stable  Complications: No apparent anesthesia complications

## 2011-09-15 ENCOUNTER — Encounter (HOSPITAL_COMMUNITY): Payer: Self-pay | Admitting: Ophthalmology

## 2012-05-16 DIAGNOSIS — H40019 Open angle with borderline findings, low risk, unspecified eye: Secondary | ICD-10-CM | POA: Diagnosis not present

## 2012-05-16 DIAGNOSIS — H524 Presbyopia: Secondary | ICD-10-CM | POA: Diagnosis not present

## 2013-10-09 ENCOUNTER — Telehealth: Payer: Self-pay | Admitting: Family Medicine

## 2013-10-09 NOTE — Telephone Encounter (Signed)
appt scheduled for 29 with miller

## 2013-10-18 ENCOUNTER — Ambulatory Visit (INDEPENDENT_AMBULATORY_CARE_PROVIDER_SITE_OTHER): Payer: Medicare Other | Admitting: Family Medicine

## 2013-10-18 ENCOUNTER — Encounter: Payer: Self-pay | Admitting: Family Medicine

## 2013-10-18 VITALS — BP 142/72 | HR 92 | Temp 97.8°F | Ht 72.0 in | Wt 175.0 lb

## 2013-10-18 DIAGNOSIS — Z0000 Encounter for general adult medical examination without abnormal findings: Secondary | ICD-10-CM | POA: Diagnosis not present

## 2013-10-18 DIAGNOSIS — K1370 Unspecified lesions of oral mucosa: Secondary | ICD-10-CM

## 2013-10-18 MED ORDER — CHLORHEXIDINE GLUCONATE 0.12 % MT SOLN: 15.0000 mL | Freq: Two times a day (BID) | OROMUCOSAL | Status: DC

## 2013-10-18 NOTE — Progress Notes (Signed)
   Subjective:    Patient ID: Thomas Davenport, male    DOB: 04/25/1945, 68 y.o.   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 After not being seen there for 3 years he was discharged from the practice. He is surprisingly healthy. His only complaint today is of a ulcer or sore on his right lower gum. He is a smoker but denies any shortness of breath chronic cough frequent bronchitis or pneumonia. He is on no medications at all.    Review of Systems  Constitutional: Negative.   HENT: Mouth sores: 1st occurrence.   Eyes: Negative.   Respiratory: Negative.   Cardiovascular: Negative.   Endocrine: Negative.   Genitourinary: Negative.   Neurological: Negative.   Psychiatric/Behavioral: Negative.        Objective:   Physical Exam  Vitals reviewed. Constitutional: He is oriented to person, place, and time. He appears well-developed and well-nourished.  HENT:  Head: Normocephalic.  Right Ear: External ear normal.  Left Ear: External ear normal.  Nose: Nose normal.  Mouth/Throat: Oropharynx is clear and moist.  Ulcer on right lower gum No adenopathy or other lesions  Eyes: Conjunctivae and EOM are normal. Pupils are equal, round, and reactive to light.  Neck: Normal range of motion. Neck supple.  Cardiovascular: Normal rate, regular rhythm, normal heart sounds and intact distal pulses.   Pulmonary/Chest: Effort normal and breath sounds normal.  Abdominal: Soft. Bowel sounds are normal.  Musculoskeletal: Normal range of motion.  Neurological: He is alert and oriented to person, place, and time.  Skin: Skin is warm and dry.  Psychiatric: He has a normal mood and affect. His behavior is normal. Judgment and thought content normal.          Assessment & Plan:  Apthous ulcer.  Will treat with peridex.  If not resolved in 1-2 weeks, get oral surgery consult Pt appears surprisingly healthy for 68; on no meds.   He is a smoker and it is unlikely that there are not occult diseases. Wardell Honour MD

## 2013-10-19 ENCOUNTER — Other Ambulatory Visit: Payer: Medicare Other

## 2013-10-20 ENCOUNTER — Telehealth: Payer: Self-pay | Admitting: Family Medicine

## 2013-10-21 NOTE — Addendum Note (Signed)
Addended by: Ilean China on: 10/21/2013 11:15 AM   Modules accepted: Orders

## 2013-10-23 DIAGNOSIS — E785 Hyperlipidemia, unspecified: Secondary | ICD-10-CM | POA: Diagnosis not present

## 2013-10-23 DIAGNOSIS — Z0000 Encounter for general adult medical examination without abnormal findings: Secondary | ICD-10-CM | POA: Diagnosis not present

## 2013-10-23 NOTE — Addendum Note (Signed)
Addended by: Selmer Dominion on: 10/23/2013 09:49 AM   Modules accepted: Orders

## 2013-10-23 NOTE — Addendum Note (Signed)
Addended by: Selmer Dominion on: 10/23/2013 09:48 AM   Modules accepted: Orders

## 2013-10-24 LAB — CMP14+EGFR
ALBUMIN: 3.8 g/dL (ref 3.5–4.8)
ALK PHOS: 129 IU/L — AB (ref 39–117)
ALT: 9 IU/L (ref 0–44)
AST: 20 IU/L (ref 0–40)
Albumin/Globulin Ratio: 1.7 (ref 1.1–2.5)
BILIRUBIN TOTAL: 0.5 mg/dL (ref 0.0–1.2)
BUN / CREAT RATIO: 5 — AB (ref 10–22)
BUN: 5 mg/dL — AB (ref 8–27)
CO2: 22 mmol/L (ref 18–29)
CREATININE: 1.02 mg/dL (ref 0.76–1.27)
Calcium: 9.2 mg/dL (ref 8.6–10.2)
Chloride: 97 mmol/L (ref 97–108)
GFR calc non Af Amer: 74 mL/min/{1.73_m2} (ref >59)
GFR, EST AFRICAN AMERICAN: 86 mL/min/{1.73_m2} (ref >59)
GLOBULIN, TOTAL: 2.3 g/dL (ref 1.5–4.5)
Glucose: 85 mg/dL (ref 65–99)
Potassium: 5.2 mmol/L (ref 3.5–5.2)
Sodium: 135 mmol/L (ref 134–144)
Total Protein: 6.1 g/dL (ref 6.0–8.5)

## 2013-10-24 LAB — LIPID PANEL
CHOL/HDL RATIO: 3 ratio (ref 0.0–5.0)
Cholesterol, Total: 160 mg/dL (ref 100–199)
HDL: 54 mg/dL (ref >39)
LDL CALC: 87 mg/dL (ref 0–99)
TRIGLYCERIDES: 93 mg/dL (ref 0–149)
VLDL Cholesterol Cal: 19 mg/dL (ref 5–40)

## 2014-02-19 ENCOUNTER — Telehealth: Payer: Self-pay | Admitting: Family Medicine

## 2014-02-19 NOTE — Telephone Encounter (Signed)
Aware, needs to be seen.

## 2014-03-06 ENCOUNTER — Encounter (HOSPITAL_COMMUNITY): Payer: Self-pay | Admitting: Emergency Medicine

## 2014-03-06 ENCOUNTER — Inpatient Hospital Stay (HOSPITAL_COMMUNITY)
Admission: EM | Admit: 2014-03-06 | Discharge: 2014-03-08 | DRG: 897 | Disposition: A | Discharge status: Home Health | Payer: Medicare Other | Attending: Internal Medicine | Admitting: Internal Medicine

## 2014-03-06 ENCOUNTER — Emergency Department (HOSPITAL_COMMUNITY): Payer: Medicare Other

## 2014-03-06 DIAGNOSIS — Y92009 Unspecified place in unspecified non-institutional (private) residence as the place of occurrence of the external cause: Secondary | ICD-10-CM

## 2014-03-06 DIAGNOSIS — W19XXXA Unspecified fall, initial encounter: Secondary | ICD-10-CM | POA: Diagnosis present

## 2014-03-06 DIAGNOSIS — S299XXA Unspecified injury of thorax, initial encounter: Secondary | ICD-10-CM | POA: Diagnosis not present

## 2014-03-06 DIAGNOSIS — D72829 Elevated white blood cell count, unspecified: Secondary | ICD-10-CM | POA: Diagnosis present

## 2014-03-06 DIAGNOSIS — S32019A Unspecified fracture of first lumbar vertebra, initial encounter for closed fracture: Secondary | ICD-10-CM | POA: Diagnosis not present

## 2014-03-06 DIAGNOSIS — M545 Low back pain: Secondary | ICD-10-CM | POA: Diagnosis present

## 2014-03-06 DIAGNOSIS — R55 Syncope and collapse: Secondary | ICD-10-CM | POA: Diagnosis not present

## 2014-03-06 DIAGNOSIS — J449 Chronic obstructive pulmonary disease, unspecified: Secondary | ICD-10-CM | POA: Diagnosis present

## 2014-03-06 DIAGNOSIS — H409 Unspecified glaucoma: Secondary | ICD-10-CM | POA: Diagnosis not present

## 2014-03-06 DIAGNOSIS — Z720 Tobacco use: Secondary | ICD-10-CM | POA: Diagnosis present

## 2014-03-06 DIAGNOSIS — R42 Dizziness and giddiness: Secondary | ICD-10-CM | POA: Diagnosis not present

## 2014-03-06 DIAGNOSIS — S300XXA Contusion of lower back and pelvis, initial encounter: Secondary | ICD-10-CM | POA: Diagnosis not present

## 2014-03-06 DIAGNOSIS — W1830XA Fall on same level, unspecified, initial encounter: Secondary | ICD-10-CM | POA: Diagnosis present

## 2014-03-06 DIAGNOSIS — M549 Dorsalgia, unspecified: Secondary | ICD-10-CM | POA: Diagnosis present

## 2014-03-06 DIAGNOSIS — F1010 Alcohol abuse, uncomplicated: Principal | ICD-10-CM | POA: Diagnosis present

## 2014-03-06 DIAGNOSIS — F17210 Nicotine dependence, cigarettes, uncomplicated: Secondary | ICD-10-CM | POA: Diagnosis present

## 2014-03-06 DIAGNOSIS — Y901 Blood alcohol level of 20-39 mg/100 ml: Secondary | ICD-10-CM | POA: Diagnosis present

## 2014-03-06 DIAGNOSIS — S3992XA Unspecified injury of lower back, initial encounter: Secondary | ICD-10-CM | POA: Diagnosis not present

## 2014-03-06 DIAGNOSIS — E871 Hypo-osmolality and hyponatremia: Secondary | ICD-10-CM | POA: Diagnosis present

## 2014-03-06 DIAGNOSIS — R404 Transient alteration of awareness: Secondary | ICD-10-CM | POA: Diagnosis not present

## 2014-03-06 DIAGNOSIS — M4856XA Collapsed vertebra, not elsewhere classified, lumbar region, initial encounter for fracture: Secondary | ICD-10-CM | POA: Diagnosis present

## 2014-03-06 DIAGNOSIS — S32010A Wedge compression fracture of first lumbar vertebra, initial encounter for closed fracture: Secondary | ICD-10-CM

## 2014-03-06 LAB — COMPREHENSIVE METABOLIC PANEL
ALBUMIN: 3 g/dL — AB (ref 3.5–5.2)
ALT: 12 U/L (ref 0–53)
AST: 27 U/L (ref 0–37)
Alkaline Phosphatase: 114 U/L (ref 39–117)
Anion gap: 16 — ABNORMAL HIGH (ref 5–15)
BUN: 4 mg/dL — ABNORMAL LOW (ref 6–23)
CALCIUM: 9 mg/dL (ref 8.4–10.5)
CO2: 17 mEq/L — ABNORMAL LOW (ref 19–32)
CREATININE: 0.71 mg/dL (ref 0.50–1.35)
Chloride: 96 mEq/L (ref 96–112)
GFR calc Af Amer: >90 mL/min (ref >90)
GFR calc non Af Amer: >90 mL/min (ref >90)
Glucose, Bld: 87 mg/dL (ref 70–99)
Potassium: 4.2 mEq/L (ref 3.7–5.3)
Sodium: 129 mEq/L — ABNORMAL LOW (ref 137–147)
Total Bilirubin: 0.4 mg/dL (ref 0.3–1.2)
Total Protein: 6.6 g/dL (ref 6.0–8.3)

## 2014-03-06 LAB — URINALYSIS, ROUTINE W REFLEX MICROSCOPIC
Bilirubin Urine: NEGATIVE
GLUCOSE, UA: NEGATIVE mg/dL
Hgb urine dipstick: NEGATIVE
Ketones, ur: NEGATIVE mg/dL
LEUKOCYTES UA: NEGATIVE
NITRITE: NEGATIVE
PH: 6 (ref 5.0–8.0)
Protein, ur: NEGATIVE mg/dL
SPECIFIC GRAVITY, URINE: 1.003 — AB (ref 1.005–1.030)
Urobilinogen, UA: 0.2 mg/dL (ref 0.0–1.0)

## 2014-03-06 LAB — CBC WITH DIFFERENTIAL/PLATELET
BASOS ABS: 0.1 10*3/uL (ref 0.0–0.1)
BASOS PCT: 1 % (ref 0–1)
EOS PCT: 1 % (ref 0–5)
Eosinophils Absolute: 0.1 10*3/uL (ref 0.0–0.7)
HCT: 44 % (ref 39.0–52.0)
Hemoglobin: 15.4 g/dL (ref 13.0–17.0)
LYMPHS PCT: 10 % — AB (ref 12–46)
Lymphs Abs: 1.2 10*3/uL (ref 0.7–4.0)
MCH: 37.5 pg — ABNORMAL HIGH (ref 26.0–34.0)
MCHC: 35 g/dL (ref 30.0–36.0)
MCV: 107.1 fL — AB (ref 78.0–100.0)
MONO ABS: 1.3 10*3/uL — AB (ref 0.1–1.0)
Monocytes Relative: 11 % (ref 3–12)
NEUTROS ABS: 9.1 10*3/uL — AB (ref 1.7–7.7)
Neutrophils Relative %: 77 % (ref 43–77)
PLATELETS: 177 10*3/uL (ref 150–400)
RBC: 4.11 MIL/uL — ABNORMAL LOW (ref 4.22–5.81)
RDW: 17 % — AB (ref 11.5–15.5)
WBC: 11.8 10*3/uL — AB (ref 4.0–10.5)

## 2014-03-06 LAB — TROPONIN I

## 2014-03-06 LAB — ETHANOL: ALCOHOL ETHYL (B): 26 mg/dL — AB (ref 0–11)

## 2014-03-06 MED ORDER — HYDROMORPHONE HCL 1 MG/ML IJ SOLN
1.0000 mg | Freq: Once | INTRAMUSCULAR | Status: AC
  Administered 2014-03-06: 1 mg via INTRAVENOUS
  Filled 2014-03-06: qty 1

## 2014-03-06 MED ORDER — SODIUM CHLORIDE 0.9 % IV BOLUS (SEPSIS)
500.0000 mL | Freq: Once | INTRAVENOUS | Status: AC
  Administered 2014-03-06: 500 mL via INTRAVENOUS

## 2014-03-06 NOTE — ED Provider Notes (Signed)
CSN: [id number]     Arrival date & time 03/06/14  2053 History   First MD Initiated Contact with Patient 03/06/14 2149     Chief Complaint  Patient presents with  . Fall  . Back Pain     (Consider location/radiation/quality/duration/timing/severity/associated sxs/prior Treatment) Patient is a 68 y.o. male presenting with fall and back pain.  Fall This is a new problem. The current episode started 1 to 2 hours ago. Episode frequency: once. The problem has been resolved. Pertinent negatives include no chest pain, no abdominal pain, no headaches and no shortness of breath. The symptoms are aggravated by standing. Nothing relieves the symptoms. He has tried nothing for the symptoms. The treatment provided no relief.  Back Pain Location:  Lumbar spine Quality:  Aching Radiates to:  Does not radiate Pain severity:  Moderate Pain is:  Same all the time Onset quality:  Sudden Duration:  1 hour Timing:  Constant Progression:  Unchanged Chronicity:  New Context comment:  After a fall Relieved by:  None tried Worsened by:  Nothing tried Ineffective treatments:  None tried Associated symptoms: no abdominal pain, no chest pain, no dysuria, no fever, no headaches and no numbness     Past Medical History  Diagnosis Date  . COPD (chronic obstructive pulmonary disease)   . Glaucoma     left eyes surgical correction 2013   Past Surgical History  Procedure Laterality Date  . Cataract extraction w/phaco  08/31/2011    Procedure: CATARACT EXTRACTION PHACO AND INTRAOCULAR LENS PLACEMENT (IOC);  Surgeon: Tonny Branch, MD;  Location: [facility];  Service: Ophthalmology;  Laterality: Left;  CDE:22.56  . Cataract extraction w/phaco  09/14/2011    Procedure: CATARACT EXTRACTION PHACO AND INTRAOCULAR LENS PLACEMENT (IOC);  Surgeon: Tonny Branch, MD;  Location: [facility];  Service: Ophthalmology;  Laterality: Right;  CDE:24.60    Family History  Problem Relation Age of Onset  . Tuberculosis Father   . Cancer  Brother     lung  . Heart attack Brother 59   History  Substance Use Topics  . Smoking status: Current Every Day Smoker -- 1.50 packs/day for 45 years    Types: Cigarettes  . Smokeless tobacco: Never Used  . Alcohol Use: 42.0 oz/week    70 Cans of beer per week     Comment: about 10 cans of beer a day    Review of Systems  Constitutional: Negative for fever.  HENT: Negative for drooling and rhinorrhea.   Eyes: Negative for pain.  Respiratory: Negative for cough and shortness of breath.   Cardiovascular: Negative for chest pain and leg swelling.  Gastrointestinal: Negative for nausea, vomiting, abdominal pain and diarrhea.  Genitourinary: Negative for dysuria and hematuria.  Musculoskeletal: Positive for back pain. Negative for gait problem and neck pain.  Skin: Negative for color change.  Neurological: Positive for syncope. Negative for numbness and headaches.  Hematological: Negative for adenopathy.  Psychiatric/Behavioral: Negative for behavioral problems.  All other systems reviewed and are negative.     Allergies  Pineapple  Home Medications   Prior to Admission medications   Medication Sig Start Date End Date Taking? Authorizing Provider  chlorhexidine (PERIDEX) 0.12 % solution Use as directed 15 mLs in the mouth or throat 2 (two) times daily. 10/18/13   Wardell Honour, MD  fexofenadine (ALLEGRA) 180 MG tablet Take 180 mg by mouth daily.    Historical Provider, MD   BP 135/58 mmHg  Pulse 89  Temp(Src) 98 F (36.7  C) (Oral)  Resp 21  SpO2 97% Physical Exam  Constitutional: He is oriented to person, place, and time. He appears well-developed and well-nourished.  HENT:  Head: Normocephalic and atraumatic.  Right Ear: External ear normal.  Left Ear: External ear normal.  Nose: Nose normal.  Mouth/Throat: Oropharynx is clear and moist. No oropharyngeal exudate.  Eyes: Conjunctivae and EOM are normal. Pupils are equal, round, and reactive to light.  Neck:  Normal range of motion. Neck supple.  Cardiovascular: Normal rate, regular rhythm, normal heart sounds and intact distal pulses.  Exam reveals no gallop and no friction rub.   No murmur heard. Pulmonary/Chest: Effort normal and breath sounds normal. No respiratory distress. He has no wheezes.  Abdominal: Soft. Bowel sounds are normal. He exhibits no distension. There is no tenderness. There is no rebound and no guarding.  Musculoskeletal: Normal range of motion. He exhibits tenderness. He exhibits no edema.  Mild tenderness to palpation of the midline lower lumbar spine.  Mild skin tear to left medial forearm.  Neurological: He is alert and oriented to person, place, and time.  alert, oriented x3 speech: normal in context and clarity memory: intact grossly cranial nerves II-XII: intact motor strength: full proximally and distally no involuntary movements or tremors sensation: intact to light touch diffusely  cerebellar: finger-to-nose and heel-to-shin intact Gait: initially deferred at request of pt d/t back pain, upon re-examination pt has difficulty gaining his balance   Skin: Skin is warm and dry.  Psychiatric: He has a normal mood and affect. His behavior is normal.  Nursing note and vitals reviewed.   ED Course  Procedures (including critical care time) Labs Review Labs Reviewed  CBC WITH DIFFERENTIAL - Abnormal; Notable for the following:    WBC 11.8 (*)    RBC 4.11 (*)    MCV 107.1 (*)    MCH 37.5 (*)    RDW 17.0 (*)    Neutro Abs 9.1 (*)    Lymphocytes Relative 10 (*)    Monocytes Absolute 1.3 (*)    All other components within normal limits  COMPREHENSIVE METABOLIC PANEL - Abnormal; Notable for the following:    Sodium 129 (*)    CO2 17 (*)    BUN 4 (*)    Albumin 3.0 (*)    Anion gap 16 (*)    All other components within normal limits  URINALYSIS, ROUTINE W REFLEX MICROSCOPIC - Abnormal; Notable for the following:    Specific Gravity, Urine 1.003 (*)    All  other components within normal limits  ETHANOL - Abnormal; Notable for the following:    Alcohol, Ethyl (B) 26 (*)    All other components within normal limits  URINE RAPID DRUG SCREEN (HOSP PERFORMED) - Abnormal; Notable for the following:    Opiates POSITIVE (*)    All other components within normal limits  COMPREHENSIVE METABOLIC PANEL - Abnormal; Notable for the following:    Glucose, Bld 109 (*)    BUN 3 (*)    Albumin 3.0 (*)    GFR calc non Af Amer 89 (*)    All other components within normal limits  CBC - Abnormal; Notable for the following:    RBC 3.97 (*)    MCV 106.8 (*)    MCH 36.8 (*)    RDW 16.9 (*)    All other components within normal limits  CBC - Abnormal; Notable for the following:    RBC 4.00 (*)    MCV 107.3 (*)  MCH 36.5 (*)    RDW 16.9 (*)    All other components within normal limits  BASIC METABOLIC PANEL - Abnormal; Notable for the following:    Sodium 132 (*)    BUN 3 (*)    GFR calc non Af Amer 85 (*)    All other components within normal limits  TROPONIN I  TSH  LIPID PANEL  TROPONIN I  TROPONIN I  TROPONIN I  PROTIME-INR  VITAMIN B12    Imaging Review Dg Chest 2 View  03/07/2014   CLINICAL DATA:  Fall.  EXAM: CHEST  2 VIEW  COMPARISON:  None.  FINDINGS: Normal heart size and mediastinal contours. No acute infiltrate or edema. No effusion or pneumothorax. No evidence of acute fracture. There are calcified pleural plaques bilaterally, most confluent over the diaphragm. The lungs are mildly hyperinflated.  IMPRESSION: 1. No evidence of intrathoracic injury or fracture. 2. Bilateral calcified pleural plaques compatible with asbestos exposure.   Electronically Signed   By: Jorje Guild M.D.   On: 03/07/2014 00:16   Dg Lumbar Spine Complete  03/07/2014   CLINICAL DATA:  Low back pain after fall.  Initial encounter  EXAM: LUMBAR SPINE - COMPLETE 4+ VIEW  COMPARISON:  None.  FINDINGS: L1 compression fracture involving the superior endplate,  predominantly left-sided. Height loss is mild (25% or less).  No traumatic malalignment.  There is diffuse spondylotic endplate spurring and disc narrowing. No notable facet degenerative change.  Extensive arterial calcification. Bilateral calcified pleural plaques.  IMPRESSION: Age-indeterminate L1 compression fracture with mild height loss.   Electronically Signed   By: Jorje Guild M.D.   On: 03/07/2014 00:18   Ct Head Wo Contrast  03/07/2014   CLINICAL DATA:  mechanical fall 03-06-14, pt.states he hit his lt. Side of his head  EXAM: CT HEAD WITHOUT CONTRAST  TECHNIQUE: Contiguous axial images were obtained from the base of the skull through the vertex without intravenous contrast.  COMPARISON:  None.  FINDINGS: Atherosclerotic and physiologic intracranial calcifications. Moderate diffuse parenchymal atrophy. Patchy Mild areas of hypoattenuation in deep and periventricular white matter bilaterally. Negative for acute intracranial hemorrhage, mass lesion, acute infarction, midline shift, or mass-effect. Acute infarct may be inapparent on noncontrast CT. Ventricles and sulci symmetric. Bone windows demonstrate no focal lesion.  IMPRESSION: 1. Negative for bleed or other acute intracranial process. 2. Atrophy and mild nonspecific white matter changes as above.   Electronically Signed   By: Arne Cleveland M.D.   On: 03/07/2014 09:06     EKG Interpretation   Date/Time:  Tuesday March 06 2014 22:11:43 EST Ventricular Rate:  88 PR Interval:  148 QRS Duration: 102 QT Interval:  376 QTC Calculation: 455 R Axis:   81 Text Interpretation:  Sinus rhythm Borderline right axis deviation No  significant change since last tracing Confirmed by Thinh Cuccaro  MD, Albeiro Trompeter  ([id number]) on 03/06/2014 10:53:21 PM      MDM   Final diagnoses:  Fall  Syncope, unspecified syncope type  Contusion of lower back, initial encounter  Compression fracture of L1 lumbar vertebra, closed, initial encounter  Glaucoma     10:06 PM 68 y.o. male who presents with a fall which occurred prior to arrival. He states that he had been taking a nap and got up to turn the light on. After being up for several seconds he believes he may have syncopized. He fell to the ground and hit his lower back and head on the wall. Questionable loss of  consciousness. He currently complains of low back pain but denies any headache. He is alert and oriented 3 and has a normal neurologic exam. He has had several episodes of syncope this year and has been evaluated by his family doctor for this previously. He is afebrile and vital signs are unremarkable here. We'll get screening lab work. No evidence of trauma to the scalp and the patient has no headache. Do not think a CT of the head as necessary.  Tdap is up-to-date per the family. The pt does drink daily. Probably drank 6-8 beers today.   Pt found to have L1 comp fx. While pain is controlled he is having difficulty w/ balance. Possibly related to the pain medicine but will admit for obs.   Pamella Pert, MD 03/08/14 [phone]

## 2014-03-06 NOTE — ED Notes (Signed)
Brought in by EMS from home with c/o lower back pain.  Pt reported that he had a mechanical fall tonight and has had immediate pain to lower back after fall--- no other complaints;  pt denies hitting head; pt denies headache or dizziness.  Pt was given Morphine Sulfate 3 mg IV en route to ED.  Pt presents to ED A/Ox4, in no s/s acute distress but still c/o lower back pain 9/10.

## 2014-03-06 NOTE — Progress Notes (Signed)
CSW met with patient at bedside. Family was present. Patient stated that he is not feeling the best. Patient informed CSW that comes from home. Patient says he has fell before in the past, but feels as if he is okay and is not interested in a home care agency at this time. Patient is about to be transported to X-ray.  Willette Brace [phone] ED CSW 03/06/2014 10:29 PM

## 2014-03-06 NOTE — ED Notes (Signed)
Bed: FH21 Expected date: 03/06/14 Expected time: 8:22 PM Means of arrival: Ambulance Comments: 68 yr old, fall, lower back pain

## 2014-03-07 ENCOUNTER — Observation Stay (HOSPITAL_COMMUNITY): Payer: Medicare Other

## 2014-03-07 ENCOUNTER — Encounter (HOSPITAL_COMMUNITY): Payer: Self-pay | Admitting: *Deleted

## 2014-03-07 DIAGNOSIS — M545 Low back pain: Secondary | ICD-10-CM | POA: Diagnosis present

## 2014-03-07 DIAGNOSIS — E871 Hypo-osmolality and hyponatremia: Secondary | ICD-10-CM | POA: Diagnosis present

## 2014-03-07 DIAGNOSIS — M549 Dorsalgia, unspecified: Secondary | ICD-10-CM | POA: Diagnosis present

## 2014-03-07 DIAGNOSIS — F17210 Nicotine dependence, cigarettes, uncomplicated: Secondary | ICD-10-CM | POA: Diagnosis present

## 2014-03-07 DIAGNOSIS — S32010A Wedge compression fracture of first lumbar vertebra, initial encounter for closed fracture: Secondary | ICD-10-CM | POA: Insufficient documentation

## 2014-03-07 DIAGNOSIS — J449 Chronic obstructive pulmonary disease, unspecified: Secondary | ICD-10-CM | POA: Diagnosis present

## 2014-03-07 DIAGNOSIS — D72829 Elevated white blood cell count, unspecified: Secondary | ICD-10-CM | POA: Diagnosis present

## 2014-03-07 DIAGNOSIS — Y901 Blood alcohol level of 20-39 mg/100 ml: Secondary | ICD-10-CM | POA: Diagnosis present

## 2014-03-07 DIAGNOSIS — R55 Syncope and collapse: Secondary | ICD-10-CM | POA: Diagnosis present

## 2014-03-07 DIAGNOSIS — Y92009 Unspecified place in unspecified non-institutional (private) residence as the place of occurrence of the external cause: Secondary | ICD-10-CM | POA: Diagnosis not present

## 2014-03-07 DIAGNOSIS — Z720 Tobacco use: Secondary | ICD-10-CM | POA: Diagnosis present

## 2014-03-07 DIAGNOSIS — W19XXXA Unspecified fall, initial encounter: Secondary | ICD-10-CM | POA: Diagnosis present

## 2014-03-07 DIAGNOSIS — W1830XA Fall on same level, unspecified, initial encounter: Secondary | ICD-10-CM | POA: Diagnosis present

## 2014-03-07 DIAGNOSIS — F1010 Alcohol abuse, uncomplicated: Secondary | ICD-10-CM | POA: Diagnosis present

## 2014-03-07 DIAGNOSIS — S0990XA Unspecified injury of head, initial encounter: Secondary | ICD-10-CM | POA: Diagnosis not present

## 2014-03-07 DIAGNOSIS — H409 Unspecified glaucoma: Secondary | ICD-10-CM | POA: Insufficient documentation

## 2014-03-07 DIAGNOSIS — M4856XA Collapsed vertebra, not elsewhere classified, lumbar region, initial encounter for fracture: Secondary | ICD-10-CM | POA: Diagnosis present

## 2014-03-07 LAB — PROTIME-INR
INR: 0.95 (ref 0.00–1.49)
Prothrombin Time: 12.8 seconds (ref 11.6–15.2)

## 2014-03-07 LAB — LIPID PANEL
Cholesterol: 140 mg/dL (ref 0–200)
HDL: 51 mg/dL (ref >39)
LDL CALC: 72 mg/dL (ref 0–99)
TRIGLYCERIDES: 86 mg/dL (ref <150)
Total CHOL/HDL Ratio: 2.7 RATIO
VLDL: 17 mg/dL (ref 0–40)

## 2014-03-07 LAB — COMPREHENSIVE METABOLIC PANEL
ALT: 11 U/L (ref 0–53)
AST: 24 U/L (ref 0–37)
Albumin: 3 g/dL — ABNORMAL LOW (ref 3.5–5.2)
Alkaline Phosphatase: 117 U/L (ref 39–117)
Anion gap: 13 (ref 5–15)
BUN: 3 mg/dL — ABNORMAL LOW (ref 6–23)
CO2: 22 meq/L (ref 19–32)
Calcium: 9 mg/dL (ref 8.4–10.5)
Chloride: 102 mEq/L (ref 96–112)
Creatinine, Ser: 0.78 mg/dL (ref 0.50–1.35)
GFR calc Af Amer: >90 mL/min (ref >90)
GFR, EST NON AFRICAN AMERICAN: 89 mL/min — AB (ref >90)
Glucose, Bld: 109 mg/dL — ABNORMAL HIGH (ref 70–99)
Potassium: 4.4 mEq/L (ref 3.7–5.3)
SODIUM: 137 meq/L (ref 137–147)
TOTAL PROTEIN: 6.2 g/dL (ref 6.0–8.3)
Total Bilirubin: 0.6 mg/dL (ref 0.3–1.2)

## 2014-03-07 LAB — TSH: TSH: 3.63 u[IU]/mL (ref 0.350–4.500)

## 2014-03-07 LAB — RAPID URINE DRUG SCREEN, HOSP PERFORMED
Amphetamines: NOT DETECTED
BARBITURATES: NOT DETECTED
Benzodiazepines: NOT DETECTED
Cocaine: NOT DETECTED
Opiates: POSITIVE — AB
Tetrahydrocannabinol: NOT DETECTED

## 2014-03-07 LAB — CBC
HCT: 42.4 % (ref 39.0–52.0)
HEMOGLOBIN: 14.6 g/dL (ref 13.0–17.0)
MCH: 36.8 pg — ABNORMAL HIGH (ref 26.0–34.0)
MCHC: 34.4 g/dL (ref 30.0–36.0)
MCV: 106.8 fL — AB (ref 78.0–100.0)
Platelets: 179 10*3/uL (ref 150–400)
RBC: 3.97 MIL/uL — AB (ref 4.22–5.81)
RDW: 16.9 % — ABNORMAL HIGH (ref 11.5–15.5)
WBC: 10.3 10*3/uL (ref 4.0–10.5)

## 2014-03-07 LAB — TROPONIN I
Troponin I: <0.3 ng/mL (ref <0.30)
Troponin I: <0.3 ng/mL (ref <0.30)
Troponin I: <0.3 ng/mL (ref <0.30)

## 2014-03-07 MED ORDER — HEPARIN SODIUM (PORCINE) 5000 UNIT/ML IJ SOLN
5000.0000 [IU] | Freq: Three times a day (TID) | INTRAMUSCULAR | Status: DC
  Administered 2014-03-07 – 2014-03-08 (×4): 5000 [IU] via SUBCUTANEOUS
  Filled 2014-03-07 (×6): qty 1

## 2014-03-07 MED ORDER — SODIUM CHLORIDE 0.9 % IV SOLN
INTRAVENOUS | Status: DC
  Administered 2014-03-07 – 2014-03-08 (×3): via INTRAVENOUS

## 2014-03-07 MED ORDER — LORAZEPAM 2 MG/ML IJ SOLN
1.0000 mg | Freq: Four times a day (QID) | INTRAMUSCULAR | Status: DC | PRN
  Administered 2014-03-07: 1 mg via INTRAVENOUS
  Filled 2014-03-07: qty 1

## 2014-03-07 MED ORDER — LORAZEPAM 2 MG/ML IJ SOLN: 0.0000 mg | Freq: Four times a day (QID) | INTRAMUSCULAR | Status: DC

## 2014-03-07 MED ORDER — ADULT MULTIVITAMIN W/MINERALS CH
1.0000 | ORAL_TABLET | Freq: Every day | ORAL | Status: DC
  Administered 2014-03-07 – 2014-03-08 (×2): 1 via ORAL
  Filled 2014-03-07 (×2): qty 1

## 2014-03-07 MED ORDER — VITAMIN B-1 100 MG PO TABS
100.0000 mg | ORAL_TABLET | Freq: Every day | ORAL | Status: DC
  Administered 2014-03-07 – 2014-03-08 (×2): 100 mg via ORAL
  Filled 2014-03-07 (×2): qty 1

## 2014-03-07 MED ORDER — LORAZEPAM 2 MG/ML IJ SOLN: 0.0000 mg | Freq: Two times a day (BID) | INTRAMUSCULAR | Status: DC

## 2014-03-07 MED ORDER — SODIUM CHLORIDE 0.9 % IJ SOLN
3.0000 mL | Freq: Two times a day (BID) | INTRAMUSCULAR | Status: DC
  Administered 2014-03-07 (×2): 3 mL via INTRAVENOUS

## 2014-03-07 MED ORDER — THIAMINE HCL 100 MG/ML IJ SOLN
100.0000 mg | Freq: Every day | INTRAMUSCULAR | Status: DC
  Filled 2014-03-07 (×2): qty 1

## 2014-03-07 MED ORDER — LORAZEPAM 1 MG PO TABS: 1.0000 mg | ORAL_TABLET | Freq: Four times a day (QID) | ORAL | Status: DC | PRN

## 2014-03-07 MED ORDER — NICOTINE 21 MG/24HR TD PT24
21.0000 mg | MEDICATED_PATCH | Freq: Every day | TRANSDERMAL | Status: DC
  Administered 2014-03-07 – 2014-03-08 (×2): 21 mg via TRANSDERMAL
  Filled 2014-03-07 (×2): qty 1

## 2014-03-07 MED ORDER — FOLIC ACID 1 MG PO TABS
1.0000 mg | ORAL_TABLET | Freq: Every day | ORAL | Status: DC
  Administered 2014-03-07 – 2014-03-08 (×2): 1 mg via ORAL
  Filled 2014-03-07 (×2): qty 1

## 2014-03-07 MED ORDER — OXYCODONE-ACETAMINOPHEN 5-325 MG PO TABS: 1.0000 | ORAL_TABLET | ORAL | Status: DC | PRN

## 2014-03-07 NOTE — Progress Notes (Signed)
Patient seen and examined. Admitted after fall episode, with questionable syncope. According to patient he never LOC. Workup is demonstrating that fall is most likely associated with alcohol abuse. No CP, no SOB, no HA, no blurred vision. ?? Irregular rhythm while in ED; but subsequent telemetry evaluation has showed SR. Please referred to Dr. Soledad Gerlach H&P for further evaluation and treatment. No fever.  Plan: -PT/OT -electrolytes repletion -IVF's resuscitation -Leatha Gilding [phone]

## 2014-03-07 NOTE — Progress Notes (Signed)
OT Cancellation Note  Patient Details Name: Thomas Davenport MRN: [id number] DOB: 1946/01/25   Cancelled Treatment:    Reason Eval/Treat Not Completed: Other (comment);Patient not medically ready Pt with strict bedrest orders. Please update activity orders in order to initiate [place], OTR/L  [phone] 03/07/2014 03/07/2014, 1:53 PM

## 2014-03-07 NOTE — H&P (Signed)
[facility] History and Physical  Thomas Davenport MGQ:[id number] DOB: December 27, 1945 DOA: 03/06/2014  Referring physician: ED physician PCP: Redge Gainer, MD  Specialists:   Chief Complaint: syncope and back pain  HPI: Thomas Davenport is a 68 y.o. male with past medical history of smoking, alcohol abuse, COPD, who presents with syncope and back pain.  Per his wife, patient had nap and got up to turn the light on and passed out at about 5:30 PM. He fell to the ground and hit his lower back and head on the wall. It is not very sure whether he had loss of consciousness. After that, he complains of low back pain, but denies any headache.  Per his daughter, patient has good balance when he is not drinking alcohol, no unilateral weakness, numbness or tingling sensations. No hearing loss, vision change, incontinence.   He denies fever, chills, fatigue, headaches, cough, chest pain, SOB, abdominal pain, diarrhea, constipation, dysuria, urgency, frequency, hematuria, skin rashes, or leg swelling.   Of note, patient drinks beer every day, 3-5 beers each time. Last drink was before the event. He had 3 or 4 similar fall over last year. Per patient's daughter, EMS told them that patient's heart rate was irregularly irregular.  In ED, he was found to have negative troponin, negative urinalysis, leukocytosis with WBC 11.8. X-ray of lumbar showed age-indeterminate L1 compression fracture with mild height loss. EKG showed minimal ST segment depression in V4 to V6. Patient is admitted to inpatient for further evaluation and treatment.  Review of Systems: As presented in the history of presenting illness, rest negative.  Where does patient live?  At home Can patient participate in ADLs? Yes  Allergy:  Allergies  Allergen Reactions  . Pineapple Nausea And Vomiting    Past Medical History  Diagnosis Date  . COPD (chronic obstructive pulmonary disease)   . Glaucoma     left eyes surgical correction 2013     Past Surgical History  Procedure Laterality Date  . Cataract extraction w/phaco  08/31/2011    Procedure: CATARACT EXTRACTION PHACO AND INTRAOCULAR LENS PLACEMENT (IOC);  Surgeon: Tonny Branch, MD;  Location: [facility];  Service: Ophthalmology;  Laterality: Left;  CDE:22.56  . Cataract extraction w/phaco  09/14/2011    Procedure: CATARACT EXTRACTION PHACO AND INTRAOCULAR LENS PLACEMENT (IOC);  Surgeon: Tonny Branch, MD;  Location: [facility];  Service: Ophthalmology;  Laterality: Right;  CDE:24.60     Social History:  reports that he has been smoking Cigarettes.  He has a 67.5 pack-year smoking history. He has never used smokeless tobacco. He reports that he drinks about 42.0 oz of alcohol per week. He reports that he does not use illicit drugs.  Family History:  Family History  Problem Relation Age of Onset  . Tuberculosis Father   . Cancer Brother     lung  . Heart attack Brother 49     Prior to Admission medications   Medication Sig Start Date End Date Taking? Authorizing Provider  chlorhexidine (PERIDEX) 0.12 % solution Use as directed 15 mLs in the mouth or throat 2 (two) times daily. 10/18/13   Wardell Honour, MD  fexofenadine (ALLEGRA) 180 MG tablet Take 180 mg by mouth daily.    Historical Provider, MD  oxyCODONE-acetaminophen (PERCOCET) 5-325 MG per tablet Take 1 tablet by mouth every 4 (four) hours as needed. 03/07/14   Pamella Pert, MD    Physical Exam: Filed Vitals:   03/06/14 2671 03/06/14 2130 03/06/14 2200 03/07/14 0036  BP: 158/75 135/58 169/80 158/62  Pulse: 93 89 94 99  Temp: 98 F (36.7 C)     TempSrc: Oral     Resp: 18 21 24 20   SpO2: 95% 97% 96% 97%   General: Not in acute distress HEENT:       Eyes: PERRL, EOMI, no scleral icterus       ENT: No discharge from the ears and nose, no pharynx injection, no tonsillar enlargement.        Neck: No JVD, no bruit, no mass felt. Cardiac: S1/S2, RRR, No murmurs, No gallops or rubs Pulm: Good air movement  bilaterally. Clear to auscultation bilaterally. No rales, wheezing, rhonchi or rubs. Abd: Soft, nondistended, nontender, no rebound pain, no organomegaly, BS present Ext: No edema bilaterally. 2+DP/PT pulse bilaterally Musculoskeletal: No joint deformities, erythema, or stiffness, ROM full Skin: No rashes.  Neuro: Alert and oriented X3, cranial nerves II-XII grossly intact, muscle strength 5/5 in all extremeties, sensation to light touch intact. Brachial reflex 2+ bilaterally. Knee reflex 1+ bilaterally. Negative Babinski's sign. Normal finger to nose test. Psych: Patient is not psychotic, no suicidal or hemocidal ideation.  Labs on Admission:  Basic Metabolic Panel:  Recent Labs Lab 03/06/14 2241  NA 129*  K 4.2  CL 96  CO2 17*  GLUCOSE 87  BUN 4*  CREATININE 0.71  CALCIUM 9.0   Liver Function Tests:  Recent Labs Lab 03/06/14 2241  AST 27  ALT 12  ALKPHOS 114  BILITOT 0.4  PROT 6.6  ALBUMIN 3.0*   No results for input(s): LIPASE, AMYLASE in the last 168 hours. No results for input(s): AMMONIA in the last 168 hours. CBC:  Recent Labs Lab 03/06/14 2241  WBC 11.8*  NEUTROABS 9.1*  HGB 15.4  HCT 44.0  MCV 107.1*  PLT 177   Cardiac Enzymes:  Recent Labs Lab 03/06/14 2241  TROPONINI <0.30    BNP (last 3 results) No results for input(s): PROBNP in the last 8760 hours. CBG: No results for input(s): GLUCAP in the last 168 hours.  Radiological Exams on Admission: Dg Chest 2 View  03/07/2014   CLINICAL DATA:  Fall.  EXAM: CHEST  2 VIEW  COMPARISON:  None.  FINDINGS: Normal heart size and mediastinal contours. No acute infiltrate or edema. No effusion or pneumothorax. No evidence of acute fracture. There are calcified pleural plaques bilaterally, most confluent over the diaphragm. The lungs are mildly hyperinflated.  IMPRESSION: 1. No evidence of intrathoracic injury or fracture. 2. Bilateral calcified pleural plaques compatible with asbestos exposure.    Electronically Signed   By: Jorje Guild M.D.   On: 03/07/2014 00:16   Dg Lumbar Spine Complete  03/07/2014   CLINICAL DATA:  Low back pain after fall.  Initial encounter  EXAM: LUMBAR SPINE - COMPLETE 4+ VIEW  COMPARISON:  None.  FINDINGS: L1 compression fracture involving the superior endplate, predominantly left-sided. Height loss is mild (25% or less).  No traumatic malalignment.  There is diffuse spondylotic endplate spurring and disc narrowing. No notable facet degenerative change.  Extensive arterial calcification. Bilateral calcified pleural plaques.  IMPRESSION: Age-indeterminate L1 compression fracture with mild height loss.   Electronically Signed   By: Jorje Guild M.D.   On: 03/07/2014 00:18    EKG: Independently reviewed. minimal ST segment depression in V4 to V6  Assessment/Plan Principal Problem:   Syncope Active Problems:   COPD (chronic obstructive pulmonary disease)   Hyponatremia   Leukocytosis   Tobacco abuse   Alcohol  abuse  Syncope: It is likely due to alcohol abuse. Patient's alcohol level is 26 on admission. Other DD include, cardiac arrhythmia (ER as noticed that the patient has a irregularly irregular rate); orthostatic status; ACS (less likely, but the patient's has minimal ST segment depression in V4 to V6); TIA/stroke (less likely, given patient does not have unilateral weakness, vision or hearing change). Hyponatremia may have also contributed.  -will admit to tele bed -CT-head  -trop x 3 -ekg in AM -Lipid profile and TSH for risk stratification  -PT and OT evaluation for fall risk assessment -check UDS -Orthostatic signs  Mild hyponatremia: Sodium 129. Likely secondary to alcohol abuse and decreased oral intake. -IV fluid: Normal saline 75 mL per hour  Leukocytosis: No signs of infection. Urinalysis negative. Chest x-ray no infiltration. -Repeat CBC  Alcohol abuse: -CIWA protocol  Tobacco abuse: Patient smokes 1 pack a day for more than 50  years. -did counseling about the importance of quitting smoking -nicotine patch  COPD: Patient has mild COPD, not on medications. No signs of acute exacerbations. -OBSERVE closely.  DVT ppx: SQ Heparin  Code Status: Full code Family Communication:      Yes, patient's   family    at bed side Disposition Plan: Admit to inpatient   Date of Service 03/07/2014    Ivor Costa [facility] Pager [phone]  If 7PM-7AM, please contact night-coverage [email or web address] Password [email or web address] 03/07/2014, 1:18 AM

## 2014-03-07 NOTE — Progress Notes (Signed)
PT Cancellation Note  Patient Details Name: SAFI CULOTTA MRN: [id number] DOB: 06/17/1945   Cancelled Treatment:    Reason Eval/Treat Not Completed: Other (comment) Pt with strict bedrest order.   Please update activity order if/when pt ready for therapy.   Noel Rodier,KATHrine E 03/07/2014, 3:27 PM Carmelia Bake, PT, DPT 03/07/2014 Pager: [phone]

## 2014-03-08 DIAGNOSIS — M5440 Lumbago with sciatica, unspecified side: Secondary | ICD-10-CM

## 2014-03-08 DIAGNOSIS — R55 Syncope and collapse: Secondary | ICD-10-CM | POA: Insufficient documentation

## 2014-03-08 DIAGNOSIS — D72829 Elevated white blood cell count, unspecified: Secondary | ICD-10-CM

## 2014-03-08 DIAGNOSIS — E871 Hypo-osmolality and hyponatremia: Secondary | ICD-10-CM

## 2014-03-08 DIAGNOSIS — S32010A Wedge compression fracture of first lumbar vertebra, initial encounter for closed fracture: Secondary | ICD-10-CM

## 2014-03-08 DIAGNOSIS — Z720 Tobacco use: Secondary | ICD-10-CM

## 2014-03-08 LAB — CBC
HEMATOCRIT: 42.9 % (ref 39.0–52.0)
Hemoglobin: 14.6 g/dL (ref 13.0–17.0)
MCH: 36.5 pg — ABNORMAL HIGH (ref 26.0–34.0)
MCHC: 34 g/dL (ref 30.0–36.0)
MCV: 107.3 fL — ABNORMAL HIGH (ref 78.0–100.0)
Platelets: 191 10*3/uL (ref 150–400)
RBC: 4 MIL/uL — ABNORMAL LOW (ref 4.22–5.81)
RDW: 16.9 % — AB (ref 11.5–15.5)
WBC: 9.2 10*3/uL (ref 4.0–10.5)

## 2014-03-08 LAB — BASIC METABOLIC PANEL
Anion gap: 12 (ref 5–15)
BUN: 3 mg/dL — AB (ref 6–23)
CO2: 24 mEq/L (ref 19–32)
Calcium: 8.7 mg/dL (ref 8.4–10.5)
Chloride: 96 mEq/L (ref 96–112)
Creatinine, Ser: 0.87 mg/dL (ref 0.50–1.35)
GFR, EST NON AFRICAN AMERICAN: 85 mL/min — AB (ref >90)
Glucose, Bld: 94 mg/dL (ref 70–99)
Potassium: 4.4 mEq/L (ref 3.7–5.3)
SODIUM: 132 meq/L — AB (ref 137–147)

## 2014-03-08 LAB — VITAMIN B12: Vitamin B-12: 500 pg/mL (ref 211–911)

## 2014-03-08 MED ORDER — OXYCODONE HCL 5 MG PO TABS: 5.0000 mg | ORAL_TABLET | Freq: Three times a day (TID) | ORAL | Status: DC | PRN

## 2014-03-08 MED ORDER — NICOTINE 21 MG/24HR TD PT24: 21.0000 mg | MEDICATED_PATCH | Freq: Every day | TRANSDERMAL | Status: DC

## 2014-03-08 MED ORDER — ADULT MULTIVITAMIN W/MINERALS CH: 1.0000 | ORAL_TABLET | Freq: Every day | ORAL | Status: DC

## 2014-03-08 NOTE — Discharge Summary (Signed)
Physician Discharge Summary  ESTES LEHNER YHC:[id number] DOB: 1945/12/28 DOA: 03/06/2014  PCP: Redge Gainer, MD  Admit date: 03/06/2014 Discharge date: 03/08/2014  Time spent: >30 minutes  Recommendations for Outpatient Follow-up:  Reassess symptoms resolution Check BMET to assess electrolytes and renal function If further episode of syncope recurred, patient will need cardiac monitoring test  Discharge Diagnoses:  Principal Problem:   Syncope Active Problems:   COPD (chronic obstructive pulmonary disease)   Hyponatremia   Leukocytosis   Tobacco abuse   Alcohol abuse   Back pain   Discharge Condition: stable and improved. Will discharge home with HHPT/HHOT. Patient will follow with PCP in 10 days.  Diet recommendation: heart healthy diet  Filed Weights   03/07/14 0130  Weight: 79.017 kg (174 lb 3.2 oz)    History of present illness:  68 y.o. male with past medical history of smoking, alcohol abuse, COPD, who presents with syncope and back pain.  Per his wife, patient had nap and got up to turn the light on and fell. He fell to the ground and hit his lower back and head on the wall. It is not very sure whether he had loss of consciousness or not. After that, he complains of low back pain, but denies any headache. Per his daughter, patient has good balance when he is not drinking alcohol; no unilateral or focal weakness, no numbness or tingling sensations. No hearing loss, vision change or incontinence.   He denies fever, chills, fatigue, headaches, cough, chest pain, SOB, abdominal pain, diarrhea, constipation, dysuria, urgency, frequency, hematuria, skin rashes, or leg swelling.   Of note, patient drinks beer every day, 3-5 beers each time. Last drink was before the event. He had 3 or 4 similar fall over last year.  Hospital Course:  Syncope: It is likely due to alcohol abuse.  -no arrhythmia appreciated on 36 hours telemetry evaluation -symptoms dramatically improved  with supportive care and IVF's resuscitation -CT head neg and normal TSH/B12 -patient advise to quit drinking -instructed to start multivitamin for thiamine and folic acid supplementation -HHPT/OT for vestibular training -follow up with PCP in 10 days  Mild hyponatremia: Sodium 129. Likely secondary to alcohol abuse and decreased oral intake. -Improved with IVF's -at discharge sodium level 132 -advise to stop drinking alcohol   Leukocytosis: No signs of infection. Urinalysis negative. Chest x-ray no infiltrates. -most likely demargination -resolved with IVF's  Alcohol abuse: -CIWA protocol was used during admission -no withdrawal symptoms appreciated  Tobacco abuse: Patient smokes 1 pack a day for more than 50 years. -cessation counseling provided -nicotine patch prescribed  COPD: Patient has mild COPD, not on medications. No signs of acute exacerbations. -will benefit of PFT's as an outpatient -advise to quit smoking    Procedures:  See below for x-ray reports   Consultations:  None   Discharge Exam: Filed Vitals:   03/08/14 0529  BP: 144/61  Pulse: 86  Temp: 98.2 F (36.8 C)  Resp: 16    General: AAOX3, no focal deficit and no complaints. Wants to go home. Cardiovascular: S1 and S2, no rubs or gallops Respiratory: CTA bilaterally Abd: soft, NT, ND, positive BS Extremities: no edema, no cyanosis   Discharge Instructions You were cared for by a hospitalist during your hospital stay. If you have any questions about your discharge medications or the care you received while you were in the hospital after you are discharged, you can call the unit and asked to speak with the hospitalist on call  if the hospitalist that took care of you is not available. Once you are discharged, your primary care physician will handle any further medical issues. Please note that NO REFILLS for any discharge medications will be authorized once you are discharged, as it is imperative that  you return to your primary care physician (or establish a relationship with a primary care physician if you do not have one) for your aftercare needs so that they can reassess your need for medications and monitor your lab values.  Discharge Instructions    Discharge instructions    Complete by:  As directed   Take medications as prescribed Stop drinking alcohol Stop smoking Follow instructions and recommendations from HHPT/OT for rehabilitation     Increase activity slowly    Complete by:  As directed           Current Discharge Medication List    START taking these medications   Details  Multiple Vitamin (MULTIVITAMIN WITH MINERALS) TABS tablet Take 1 tablet by mouth daily.    nicotine (NICODERM CQ - DOSED IN MG/24 HOURS) 21 mg/24hr patch Place 1 patch (21 mg total) onto the skin daily. Qty: 28 patch, Refills: 0    oxyCODONE (OXY IR/ROXICODONE) 5 MG immediate release tablet Take 1 tablet (5 mg total) by mouth every 8 (eight) hours as needed for severe pain. Qty: 30 tablet, Refills: 0      CONTINUE these medications which have NOT CHANGED   Details  fexofenadine (ALLEGRA) 180 MG tablet Take 180 mg by mouth daily as needed (sinus issues).        Allergies  Allergen Reactions  . Pineapple Nausea And Vomiting   Follow-up Information    Follow up with Redge Gainer, MD In 2 days.   Specialty:  Family Medicine   Contact information:   [place] [address] [phone]       Follow up with [facility].   Why:  HHPT/HHOT   Contact information:   [address] [place] [address] [phone]       Follow up with Redge Gainer, MD. Schedule an appointment as soon as possible for a visit in 10 days.   Specialty:  Family Medicine   Contact information:   [place] [address] [phone]       The results of significant diagnostics from this hospitalization (including imaging, microbiology, ancillary and  laboratory) are listed below for reference.    Significant Diagnostic Studies: Dg Chest 2 View  03/07/2014   CLINICAL DATA:  Fall.  EXAM: CHEST  2 VIEW  COMPARISON:  None.  FINDINGS: Normal heart size and mediastinal contours. No acute infiltrate or edema. No effusion or pneumothorax. No evidence of acute fracture. There are calcified pleural plaques bilaterally, most confluent over the diaphragm. The lungs are mildly hyperinflated.  IMPRESSION: 1. No evidence of intrathoracic injury or fracture. 2. Bilateral calcified pleural plaques compatible with asbestos exposure.   Electronically Signed   By: Jorje Guild M.D.   On: 03/07/2014 00:16   Dg Lumbar Spine Complete  03/07/2014   CLINICAL DATA:  Low back pain after fall.  Initial encounter  EXAM: LUMBAR SPINE - COMPLETE 4+ VIEW  COMPARISON:  None.  FINDINGS: L1 compression fracture involving the superior endplate, predominantly left-sided. Height loss is mild (25% or less).  No traumatic malalignment.  There is diffuse spondylotic endplate spurring and disc narrowing. No notable facet degenerative change.  Extensive arterial calcification. Bilateral  calcified pleural plaques.  IMPRESSION: Age-indeterminate L1 compression fracture with mild height loss.   Electronically Signed   By: Jorje Guild M.D.   On: 03/07/2014 00:18   Ct Head Wo Contrast  03/07/2014   CLINICAL DATA:  mechanical fall 03-06-14, pt.states he hit his lt. Side of his head  EXAM: CT HEAD WITHOUT CONTRAST  TECHNIQUE: Contiguous axial images were obtained from the base of the skull through the vertex without intravenous contrast.  COMPARISON:  None.  FINDINGS: Atherosclerotic and physiologic intracranial calcifications. Moderate diffuse parenchymal atrophy. Patchy Mild areas of hypoattenuation in deep and periventricular white matter bilaterally. Negative for acute intracranial hemorrhage, mass lesion, acute infarction, midline shift, or mass-effect. Acute infarct may be inapparent  on noncontrast CT. Ventricles and sulci symmetric. Bone windows demonstrate no focal lesion.  IMPRESSION: 1. Negative for bleed or other acute intracranial process. 2. Atrophy and mild nonspecific white matter changes as above.   Electronically Signed   By: Arne Cleveland M.D.   On: 03/07/2014 09:06   Labs: Basic Metabolic Panel:  Recent Labs Lab 03/06/14 2241 03/07/14 0206 03/08/14 0408  NA 129* 137 132*  K 4.2 4.4 4.4  CL 96 102 96  CO2 17* 22 24  GLUCOSE 87 109* 94  BUN 4* 3* 3*  CREATININE 0.71 0.78 0.87  CALCIUM 9.0 9.0 8.7   Liver Function Tests:  Recent Labs Lab 03/06/14 2241 03/07/14 0206  AST 27 24  ALT 12 11  ALKPHOS 114 117  BILITOT 0.4 0.6  PROT 6.6 6.2  ALBUMIN 3.0* 3.0*   CBC:  Recent Labs Lab 03/06/14 2241 03/07/14 0206 03/08/14 0408  WBC 11.8* 10.3 9.2  NEUTROABS 9.1*  --   --   HGB 15.4 14.6 14.6  HCT 44.0 42.4 42.9  MCV 107.1* 106.8* 107.3*  PLT 177 179 191   Cardiac Enzymes:  Recent Labs Lab 03/06/14 2241 03/07/14 0206 03/07/14 0856 03/07/14 1407  TROPONINI <0.30 <0.30 <0.30 <0.30   Signed:  Barton Dubois  [facility] 03/08/2014, 1:38 PM

## 2014-03-08 NOTE — Care Management Note (Signed)
    Page 1 of 1   03/08/2014     3:29:17 PM CARE MANAGEMENT NOTE 03/08/2014  Patient:  Thomas Davenport, Thomas Davenport   Account Number:  [id number]  Date Initiated:  03/08/2014  Documentation initiated by:  Dessa Phi  Subjective/Objective Assessment:   68 y/o m admitted w/syncope,etoh.     Action/Plan:   From home.   Anticipated DC Date:  03/08/2014   Anticipated DC Plan:  [place]  CM consult      Choice offered to / List presented to:  C-1 Patient        [place] arranged  [place].   Status of service:  Completed, signed off Medicare Important Message given?   (If response is "NO", the following Medicare IM given date fields will be blank) Date Medicare IM given:   Medicare IM given by:   Date Additional Medicare IM given:   Additional Medicare IM given by:    Discharge Disposition:  [place]  Per UR Regulation:  Reviewed for med. necessity/level of care/duration of stay  If discussed at [facility] of Stay Meetings, dates discussed:    Comments:  03/08/14 Dessa Phi RN BSN NCM [phone] [facility] chosen for [facility], tc Kristen aware of d/c & hhc orders.

## 2014-03-08 NOTE — Evaluation (Signed)
Occupational Therapy Evaluation Patient Details Name: Thomas Davenport MRN: [id number] DOB: 03-Sep-1945 Today's Date: 03/08/2014    History of Present Illness 68 y.o. male with past medical history of smoking, alcohol abuse, COPD, who presents with syncope and back pain.  lumbar xray reveals "Age-indeterminate L1 compression fracture with mild height loss"   Clinical Impression   Pt supposed to d/c today. Pt overall at min guard assist. Wife able to assist at d/c. No DME needs. Will follow if on acute after today.    Follow Up Recommendations  Home health OT;Supervision/Assistance - 24 hour    Equipment Recommendations  None recommended by OT    Recommendations for Other Services       Precautions / Restrictions Precautions Precautions: Fall      Mobility Bed Mobility Overal bed mobility: Needs Assistance Bed Mobility: Supine to Sit;Sit to Supine     Supine to sit: Supervision Sit to supine: Supervision   General bed mobility comments: daughter attempting to help pull pt up into sitting. Explained that is better to let pt roll onto side and push himself up into sitting.   Transfers Overall transfer level: Needs assistance Equipment used: None Transfers: Sit to/from Stand Sit to Stand: Min guard              Balance                  ADL Overall ADL's : Needs assistance/impaired Eating/Feeding: Independent;Sitting   Grooming: Wash/dry hands;Set up;Sitting   Upper Body Bathing: Set up;Sitting   Lower Body Bathing: Min guard;Sit to/from stand   Upper Body Dressing : Set up;Sitting   Lower Body Dressing: Min guard;Sit to/from stand   Toilet Transfer: Min guard;Ambulation;Comfort height toilet   Toileting- Clothing Manipulation and Hygiene: Min guard;Sit to/from stand         General ADL Comments: Pt has a standard height commode but did well with sitting and standing from comfort height commode without bar. Discussed option of a 3in1 if needed  and family reports they have access to one if needed. Also discussed tubseat option for safety and they state they will look into getting one but will likely start out sponge bathing for awhile. Discussed option of 3in1 facing out of tub and sit back with assist so he doesnt have to step over tub also. Pt supposed to d/c today.  Pt able to don own shoes but didnt tie laces.      Vision                     Perception     Praxis      Pertinent Vitals/Pain Pain Assessment: 0-10 Pain Location: pt reports some soreness in back, not rated Pain Descriptors / Indicators: Sore Pain Intervention(s): Repositioned     Hand Dominance     Extremity/Trunk Assessment Upper Extremity Assessment Upper Extremity Assessment: Overall WFL for tasks assessed          Communication Communication Communication: No difficulties   Cognition Arousal/Alertness: Awake/alert Behavior During Therapy: Impulsive Overall Cognitive Status: Within Functional Limits for tasks assessed                     General Comments       Exercises       Shoulder Instructions      Home Living Family/patient expects to be discharged to:: Private residence Living Arrangements: Spouse/significant other   Type of Home: House Home Access: Stairs to  enter Entrance Stairs-Number of Steps: 1   Home Layout: One level     Bathroom Shower/Tub: Teacher, early years/pre: Standard     Home Equipment: Environmental consultant - 2 wheels;Cane - single point          Prior Functioning/Environment Level of Independence: Independent             OT Diagnosis: Generalized weakness   OT Problem List: Decreased strength;Decreased knowledge of use of DME or AE   OT Treatment/Interventions: Self-care/ADL training;Patient/family education;Therapeutic activities;DME and/or AE instruction    OT Goals(Current goals can be found in the care plan section) Acute Rehab OT Goals Patient Stated Goal: home OT Goal  Formulation: With patient Time For Goal Achievement: 03/22/14 Potential to Achieve Goals: Good  OT Frequency: Min 2X/week   Barriers to D/C:            Co-evaluation              End of Session    Activity Tolerance: Patient tolerated treatment well Patient left: in bed;with call bell/phone within reach;with bed alarm set;with family/visitor present   Time: 1240-1300 OT Time Calculation (min): 20 min Charges:  OT General Charges $OT Visit: 1 Procedure OT Evaluation $Initial OT Evaluation Tier I: 1 Procedure OT Treatments $Therapeutic Activity: 8-22 mins G-Codes:    Jules Schick  [phone] 03/08/2014, 1:14 PM

## 2014-03-08 NOTE — Evaluation (Signed)
Physical Therapy One Time Evaluation Patient Details Name: Thomas Davenport MRN: [id number] DOB: 07-07-1945 Today's Date: 03/08/2014   History of Present Illness  68 y.o. male with past medical history of smoking, alcohol abuse, COPD, who presents with syncope and back pain.  lumbar xray reveals "Age-indeterminate L1 compression fracture with mild height loss"  Clinical Impression  Patient evaluated by Physical Therapy with no further acute PT needs identified. All education has been completed and the patient has no further questions. Pt mildly unsteady with challenges to balance during ambulation and agreeable to use assistive device as needed at home for safety (at least cane however has RW as well).  Pt reports fall at home due to awakening from sleep disorientated and attempting to get to bathroom.  Pt declined HHPT for balance training.  Pt states he will likely d/c home today however if remains in hospital, recommend nursing ambulate with pt for safety.  PT is signing off. Thank you for this referral.     Follow Up Recommendations Home health PT;Supervision for mobility/OOB    Equipment Recommendations  None recommended by PT    Recommendations for Other Services       Precautions / Restrictions Precautions Precautions: Fall      Mobility  Bed Mobility Overal bed mobility: Needs Assistance Bed Mobility: Supine to Sit     Supine to sit: Supervision     General bed mobility comments: supervision only for cues for log roll technique to decrease back pain  Transfers Overall transfer level: Needs assistance Equipment used: None Transfers: Sit to/from Stand Sit to Stand: Min guard            Ambulation/Gait Ambulation/Gait assistance: Min guard Ambulation Distance (Feet): 280 Feet   Gait Pattern/deviations: Step-through pattern     General Gait Details: pt pushed IV pole for support, unsteady gait observed with any balance challenges  Stairs             Wheelchair Mobility    Modified Rankin (Stroke Patients Only)       Balance Overall balance assessment: Needs assistance                           High level balance activites: Backward walking;Sudden stops;Head turns High Level Balance Comments: unsteady with above activities without UE support however no physical assist required              Pertinent Vitals/Pain Pain Assessment: 0-10 Pain Location: reported back pain during transfers however not rated Pain Intervention(s): Monitored during session;Limited activity within patient's tolerance;Repositioned    Home Living Family/patient expects to be discharged to:: Private residence Living Arrangements: Spouse/significant other   Type of Home: House Home Access: Stairs to enter   [employer] of Steps: 1 Home Layout: One level Home Equipment: Environmental consultant - 2 wheels;Cane - single point      Prior Function Level of Independence: Independent               Hand Dominance        Extremity/Trunk Assessment               Lower Extremity Assessment: Overall WFL for tasks assessed         Communication   Communication: No difficulties  Cognition Arousal/Alertness: Awake/alert Behavior During Therapy: WFL for tasks assessed/performed Overall Cognitive Status: Within Functional Limits for tasks assessed  General Comments      Exercises        Assessment/Plan    PT Assessment All further PT needs can be met in the next venue of care  PT Diagnosis Difficulty walking   PT Problem List Decreased balance  PT Treatment Interventions     PT Goals (Current goals can be found in the Care Plan section) Acute Rehab PT Goals PT Goal Formulation: All assessment and education complete, DC therapy    Frequency     Barriers to discharge        Co-evaluation               End of Session Equipment Utilized During Treatment: Gait belt Activity  Tolerance: Patient tolerated treatment well Patient left: in chair;with call bell/phone within reach;with chair alarm set           Time: 0160-1093 PT Time Calculation (min) (ACUTE ONLY): 12 min   Charges:   PT Evaluation $Initial PT Evaluation Tier I: 1 Procedure PT Treatments $Gait Training: 8-22 mins   PT G Codes:          LEMYRE,KATHrine E 03/08/2014, 11:49 AM Carmelia Bake, PT, DPT 03/08/2014 Pager: [phone]

## 2014-03-08 NOTE — Progress Notes (Signed)
Went over all discharge information with pt and family, all questions answered.  Scripts given to wife.   IV taken out.  VSS.  Wheeled out by NT.

## 2014-04-03 ENCOUNTER — Ambulatory Visit (INDEPENDENT_AMBULATORY_CARE_PROVIDER_SITE_OTHER): Payer: Medicare Other | Admitting: Family Medicine

## 2014-04-03 ENCOUNTER — Encounter: Payer: Self-pay | Admitting: Family Medicine

## 2014-04-03 VITALS — BP 152/78 | HR 87 | Temp 97.1°F | Ht 72.0 in | Wt 165.0 lb

## 2014-04-03 DIAGNOSIS — S32010D Wedge compression fracture of first lumbar vertebra, subsequent encounter for fracture with routine healing: Secondary | ICD-10-CM

## 2014-04-03 NOTE — Progress Notes (Signed)
   Subjective:    Patient ID: Thomas Davenport, male    DOB: 02-Jan-1946, 69 y.o.   MRN: [id number]  HPI 69 year old male who is here today as follow-up hospitalization. He fail in his home on 12:15. He was admitted to [facility] with a compression fracture of L1. After brief stay in the hospital he went home from physical therapy but is doing well now. There was some question about use of alcohol is related to the fall but both he and wife and his wife deny excessive use of alcohol currently. He has been taking Tylenol for pain control preferring not to take the narcotic given to him, discharge.    Review of Systems  Constitutional: Negative.   HENT: Negative.   Eyes: Negative.   Respiratory: Negative.  Negative for shortness of breath.   Cardiovascular: Negative.  Negative for chest pain and leg swelling.  Gastrointestinal: Negative.   Genitourinary: Negative.   Musculoskeletal: Positive for back pain.  Skin: Negative.   Neurological: Negative.   Psychiatric/Behavioral: Negative.   All other systems reviewed and are negative.      Objective:   Physical Exam  Constitutional: He is oriented to person, place, and time.  Musculoskeletal: Normal range of motion. He exhibits tenderness.  Tender upper lumbar spine consistent with L1  Neurological: He is alert and oriented to person, place, and time. He has normal reflexes. Coordination normal.                   Assessment & Plan:  1. Compression fracture of L1 lumbar vertebra, with routine healing, subsequent encounter Fracture seems to be healing based on improvement of symptoms. He has finished home physical therapy and is taking very little for pain.  We spent some time talking about moderation of things such as alcohol and tobacco and I think he is on the right track.  Wardell Honour MD

## 2014-04-20 ENCOUNTER — Ambulatory Visit: Payer: Medicare Other | Admitting: Family Medicine

## 2014-04-20 DIAGNOSIS — R55 Syncope and collapse: Secondary | ICD-10-CM | POA: Diagnosis not present

## 2014-04-20 DIAGNOSIS — J449 Chronic obstructive pulmonary disease, unspecified: Secondary | ICD-10-CM | POA: Diagnosis not present

## 2014-04-20 DIAGNOSIS — R2689 Other abnormalities of gait and mobility: Secondary | ICD-10-CM | POA: Diagnosis not present

## 2014-04-20 DIAGNOSIS — M549 Dorsalgia, unspecified: Secondary | ICD-10-CM | POA: Diagnosis not present

## 2015-07-22 ENCOUNTER — Encounter: Payer: Self-pay | Admitting: Family Medicine

## 2015-07-22 ENCOUNTER — Ambulatory Visit (INDEPENDENT_AMBULATORY_CARE_PROVIDER_SITE_OTHER): Payer: Medicare Other | Admitting: Family Medicine

## 2015-07-22 VITALS — BP 158/70 | HR 75 | Temp 97.0°F | Ht 72.0 in | Wt 179.0 lb

## 2015-07-22 DIAGNOSIS — Z0000 Encounter for general adult medical examination without abnormal findings: Secondary | ICD-10-CM

## 2015-07-22 NOTE — Patient Instructions (Addendum)
Continue current medications. Continue good therapeutic lifestyle changes which include good diet and exercise. Fall precautions discussed with patient. If an FOBT was given today- please return it to our front desk. If you are over 72 years old - you may need Prevnar 13 or the adult Pneumonia vaccine.  **Flu shots are available--- please call and schedule a FLU-CLINIC appointment**  After your visit with us today you will receive a survey in the mail or online from [employer] regarding your care with us. Please take a moment to fill this out. Your feedback is very important to us as you can help us better understand your patient needs as well as improve your experience and satisfaction. WE CARE ABOUT YOU!!!    

## 2015-07-22 NOTE — Progress Notes (Signed)
Subjective:   Thomas Davenport is a 70 y.o. male who presents for an Initial Medicare Annual Wellness Visit.  Review of Systems         Objective:    Today's Vitals   07/22/15 0906  BP: 158/70  Pulse: 75  Temp: 97 F (36.1 C)  TempSrc: Oral  Height: 6' (1.829 m)  Weight: 179 lb (81.194 kg)   Body mass index is 24.27 kg/(m^2).  Current Medications (verified) Outpatient Encounter Prescriptions as of 07/22/2015  Medication Sig  . fexofenadine (ALLEGRA) 180 MG tablet Take 180 mg by mouth daily as needed (sinus issues). Reported on 07/22/2015  . Multiple Vitamin (MULTIVITAMIN WITH MINERALS) TABS tablet Take 1 tablet by mouth daily. (Patient not taking: Reported on 07/22/2015)   No facility-administered encounter medications on file as of 07/22/2015.    Allergies (verified) Pineapple   History: Past Medical History  Diagnosis Date  . COPD (chronic obstructive pulmonary disease) ([place])   . Glaucoma     left eyes surgical correction 2013   Past Surgical History  Procedure Laterality Date  . Cataract extraction w/phaco  08/31/2011    Procedure: CATARACT EXTRACTION PHACO AND INTRAOCULAR LENS PLACEMENT (IOC);  Surgeon: Tonny Branch, MD;  Location: [facility];  Service: Ophthalmology;  Laterality: Left;  CDE:22.56  . Cataract extraction w/phaco  09/14/2011    Procedure: CATARACT EXTRACTION PHACO AND INTRAOCULAR LENS PLACEMENT (IOC);  Surgeon: Tonny Branch, MD;  Location: [facility];  Service: Ophthalmology;  Laterality: Right;  CDE:24.60    Family History  Problem Relation Age of Onset  . Tuberculosis Father   . Cancer Brother     lung  . Heart attack Brother 39    Tobacco Counseling Ready to quit: Not Answered Counseling given: Not Answered   Activities of Daily Living No flowsheet data found.  Immunizations and Health Maintenance  There is no immunization history on file for this patient. There are no preventive care reminders to display for this patient.  Patient Care  Team: Wardell Honour, MD as PCP - General (Family Medicine)  Indicate any recent Medical Services you may have received from other than [facility] providers in the past year (date may be approximate).    Assessment:   This is a routine wellness examination for Thomas Davenport.   Hearing/Vision screen No exam data present  Dietary issues and exercise activities discussed:    Goals    None     Depression Screen PHQ 2/9 Scores 07/22/2015 04/03/2014  PHQ - 2 Score 0 1    Fall Risk Fall Risk  07/22/2015 04/03/2014  Falls in the past year? No Yes  Number falls in past yr: - 1  Injury with Fall? - No    Cognitive Function: MMSE - Mini Mental State Exam 07/22/2015  Orientation to time 5  Orientation to Place 5  Registration 3  Attention/ Calculation 5  Recall 2  Language- name 2 objects 2  Language- repeat 1  Language- follow 3 step command 3  Language- read & follow direction 1  Write a sentence 1  Copy design 1  Total score 29    Screening Tests Health Maintenance  Topic Date Due  . COLONOSCOPY  01/22/2016 (Originally 10/23/1993)  . ZOSTAVAX  02/21/2016 (Originally 10/24/2003)  . Hepatitis C Screening  02/21/2016 (Originally 1945/07/29)  . PNA vac Low Risk Adult (1 of 2 - PCV13) 02/21/2016 (Originally 10/23/2008)  . INFLUENZA VACCINE  10/22/2015  . TETANUS/TDAP  10/18/2020  Plan:     During the course of the visit Thomas Davenport was educated and counseled about the following appropriate screening and preventive services:   Vaccines to include Pneumoccal, Influenza, Hepatitis B, Td, Zostavax, HCV  Electrocardiogram  Colorectal cancer screening  Cardiovascular disease screening  Diabetes screening  Glaucoma screening  Nutrition counseling  Prostate cancer screening  Smoking cessation counseling  Patient Instructions (the written plan) were given to the patient.   Wardell Honour, MD   07/22/2015

## 2015-07-22 NOTE — Progress Notes (Signed)
Subjective:   Thomas Davenport is a 70 y.o. male who presents for an Initial Medicare Annual Wellness Visit.  Review of Systems        Objective:    Today's Vitals   07/22/15 0906  BP: 158/70  Pulse: 75  Temp: 97 F (36.1 C)  TempSrc: Oral  Height: 6' (1.829 m)  Weight: 179 lb (81.194 kg)   Body mass index is 24.27 kg/(m^2).  Current Medications (verified) Outpatient Encounter Prescriptions as of 07/22/2015  Medication Sig  . fexofenadine (ALLEGRA) 180 MG tablet Take 180 mg by mouth daily as needed (sinus issues). Reported on 07/22/2015  . Multiple Vitamin (MULTIVITAMIN WITH MINERALS) TABS tablet Take 1 tablet by mouth daily. (Patient not taking: Reported on 07/22/2015)   No facility-administered encounter medications on file as of 07/22/2015.    Allergies (verified) Pineapple   History: Past Medical History  Diagnosis Date  . COPD (chronic obstructive pulmonary disease) ([place])   . Glaucoma     left eyes surgical correction 2013   Past Surgical History  Procedure Laterality Date  . Cataract extraction w/phaco  08/31/2011    Procedure: CATARACT EXTRACTION PHACO AND INTRAOCULAR LENS PLACEMENT (IOC);  Surgeon: Tonny Branch, MD;  Location: [facility];  Service: Ophthalmology;  Laterality: Left;  CDE:22.56  . Cataract extraction w/phaco  09/14/2011    Procedure: CATARACT EXTRACTION PHACO AND INTRAOCULAR LENS PLACEMENT (IOC);  Surgeon: Tonny Branch, MD;  Location: [facility];  Service: Ophthalmology;  Laterality: Right;  CDE:24.60    Family History  Problem Relation Age of Onset  . Tuberculosis Father   . Cancer Brother     lung  . Heart attack Brother 3   Social History   Occupational History  . Not on file.   Social History Main Topics  . Smoking status: Current Every Day Smoker -- 1.50 packs/day for 45 years    Types: Cigarettes  . Smokeless tobacco: Never Used  . Alcohol Use: 42.0 oz/week    70 Cans of beer per week     Comment: about 10 cans of beer a day  . Drug Use:  No  . Sexual Activity: Yes    Birth Control/ Protection: None   Tobacco Counseling Ready to quit: Not Answered Counseling given: Not Answered   Activities of Daily Living No flowsheet data found.  Immunizations and Health Maintenance  There is no immunization history on file for this patient. There are no preventive care reminders to display for this patient.  Patient Care Team: Wardell Honour, MD as PCP - General (Family Medicine)  Indicate any recent Medical Services you may have received from other than [facility] providers in the past year (date may be approximate).    Assessment:   This is a routine wellness examination for Thomas Davenport. He is doing well and has no complaints today.   Hearing/Vision screen No exam data present  Dietary issues and exercise activities discussed:    Goals    None     Depression Screen PHQ 2/9 Scores 07/22/2015 04/03/2014  PHQ - 2 Score 0 1    Fall Risk Fall Risk  07/22/2015 04/03/2014  Falls in the past year? No Yes  Number falls in past yr: - 1  Injury with Fall? - No    Cognitive Function: MMSE - Mini Mental State Exam 07/22/2015  Orientation to time 5  Orientation to Place 5  Registration 3  Attention/ Calculation 5  Recall 2  Language- name 2 objects 2  Language- repeat 1  Language- follow 3 step command 3  Language- read & follow direction 1  Write a sentence 1  Copy design 1  Total score 29    Screening Tests Health Maintenance  Topic Date Due  . COLONOSCOPY  01/22/2016 (Originally 10/23/1993)  . ZOSTAVAX  02/21/2016 (Originally 10/24/2003)  . Hepatitis C Screening  02/21/2016 (Originally 07-24-1945)  . PNA vac Low Risk Adult (1 of 2 - PCV13) 02/21/2016 (Originally 10/23/2008)  . INFLUENZA VACCINE  10/22/2015  . TETANUS/TDAP  10/18/2020        Plan:      During the course of the visit Draeden was educated and counseled about the following appropriate screening and preventive services:   Vaccines to include Pneumoccal,  Influenza, Hepatitis B, Td, Zostavax, HCV  Electrocardiogram  Colorectal cancer screening  Cardiovascular disease screening  Diabetes screening  Glaucoma screening  Nutrition counseling  Prostate cancer screening  Smoking cessation counseling  Patient Instructions (the written plan) were given to the patient.   [place]ley Dec, [place]   10/25/6312

## 2015-12-30 DIAGNOSIS — Z961 Presence of intraocular lens: Secondary | ICD-10-CM | POA: Diagnosis not present

## 2015-12-30 DIAGNOSIS — H26493 Other secondary cataract, bilateral: Secondary | ICD-10-CM | POA: Diagnosis not present

## 2016-02-23 DIAGNOSIS — Z87898 Personal history of other specified conditions: Secondary | ICD-10-CM | POA: Diagnosis not present

## 2016-02-23 DIAGNOSIS — R040 Epistaxis: Secondary | ICD-10-CM | POA: Diagnosis not present

## 2016-02-23 DIAGNOSIS — F17200 Nicotine dependence, unspecified, uncomplicated: Secondary | ICD-10-CM | POA: Diagnosis not present

## 2016-08-25 ENCOUNTER — Ambulatory Visit: Payer: Medicare Other | Admitting: Family Medicine

## 2016-08-26 ENCOUNTER — Ambulatory Visit (INDEPENDENT_AMBULATORY_CARE_PROVIDER_SITE_OTHER): Payer: Medicare Other | Admitting: Family Medicine

## 2016-08-26 ENCOUNTER — Encounter: Payer: Self-pay | Admitting: Family Medicine

## 2016-08-26 VITALS — BP 174/82 | HR 77 | Temp 96.9°F | Ht 73.0 in | Wt 178.8 lb

## 2016-08-26 DIAGNOSIS — M5432 Sciatica, left side: Secondary | ICD-10-CM

## 2016-08-26 MED ORDER — PREDNISONE 20 MG PO TABS: 40.0000 mg | ORAL_TABLET | Freq: Every day | ORAL | 0 refills | Status: DC

## 2016-08-26 NOTE — Patient Instructions (Signed)
Great to see you!  If you are feeling well you can stop the prednisone after 5 days   Sciatica Sciatica is pain, numbness, weakness, or tingling along your sciatic nerve. The sciatic nerve starts in the lower back and goes down the back of each leg. Sciatica happens when this nerve is pinched or has pressure put on it. Sciatica usually goes away on its own or with treatment. Sometimes, sciatica may keep coming back (recur). Follow these instructions at home: Medicines  Take over-the-counter and prescription medicines only as told by your doctor.  Do not drive or use heavy machinery while taking prescription pain medicine. Managing pain  If directed, put ice on the affected area. ? Put ice in a plastic bag. ? Place a towel between your skin and the bag. ? Leave the ice on for 20 minutes, 2-3 times a day.  After icing, apply heat to the affected area before you exercise or as often as told by your doctor. Use the heat source that your doctor tells you to use, such as a moist heat pack or a heating pad. ? Place a towel between your skin and the heat source. ? Leave the heat on for 20-30 minutes. ? Remove the heat if your skin turns bright red. This is especially important if you are unable to feel pain, heat, or cold. You may have a greater risk of getting burned. Activity  Return to your normal activities as told by your doctor. Ask your doctor what activities are safe for you. ? Avoid activities that make your sciatica worse.  Take short rests during the day. Rest in a lying or standing position. This is usually better than sitting to rest. ? When you rest for a long time, do some physical activity or stretching between periods of rest. ? Avoid sitting for a long time without moving. Get up and move around at least one time each hour.  Exercise and stretch regularly, as told by your doctor.  Do not lift anything that is heavier than 10 lb (4.5 kg) while you have symptoms of  sciatica. ? Avoid lifting heavy things even when you do not have symptoms. ? Avoid lifting heavy things over and over.  When you lift objects, always lift in a way that is safe for your body. To do this, you should: ? Bend your knees. ? Keep the object close to your body. ? Avoid twisting. General instructions  Use good posture. ? Avoid leaning forward when you are sitting. ? Avoid hunching over when you are standing.  Stay at a healthy weight.  Wear comfortable shoes that support your feet. Avoid wearing high heels.  Avoid sleeping on a mattress that is too soft or too hard. You might have less pain if you sleep on a mattress that is firm enough to support your back.  Keep all follow-up visits as told by your doctor. This is important. Contact a doctor if:  You have pain that: ? Wakes you up when you are sleeping. ? Gets worse when you lie down. ? Is worse than the pain you have had in the past. ? Lasts longer than 4 weeks.  You lose weight for without trying. Get help right away if:  You cannot control when you pee (urinate) or poop (have a bowel movement).  You have weakness in any of these areas and it gets worse. ? Lower back. ? Lower belly (pelvis). ? Butt (buttocks). ? Legs.  You have redness or  swelling of your back.  You have a burning feeling when you pee. This information is not intended to replace advice given to you by your health care provider. Make sure you discuss any questions you have with your health care provider. Document Released: 12/17/2007 Document Revised: 08/15/2015 Document Reviewed: 11/16/2014 Elsevier Interactive Patient Education  [employer].

## 2016-08-26 NOTE — Progress Notes (Signed)
   HPI  Patient presents today here with left hip pain.  Patient's lines and she's had for 5 days of left posterior hip/buttock pain that radiates down his left leg. He denies any injury that began the issue. He has difficulty and pain with walking. He does not have any leg weakness, bowel or bladder dysfunction, or saddle anesthesia.  Patient has not had episodes similar to this previously.    PMH: Smoking status noted ROS: Per HPI  Objective: BP (!) 174/82   Pulse 77   Temp (!) 96.9 F (36.1 C) (Oral)   Ht 6\' 1"  (1.854 m)   Wt 178 lb 12.8 oz (81.1 kg)   BMI 23.59 kg/m  Gen: NAD, alert, cooperative with exam HEENT: NCAT CV: RRR, good S1/S2, no murmur Resp: CTABL, no wheezes, non-labored Ext: No edema, warm Neuro: Alert and oriented, strength 5/5 and sensation intact in bilateral lower extremities, 2+ patellar tendon reflexes bilaterally Musculoskeletal Mild tenderness to palpation of left SI area with more tenderness below that over the left buttock, Positive straight leg raise on the left  Assessment and plan:  # Sciatica Treat with steroid burst, 40 milligrams daily 7 days. Discussed usual course of illness, very low threshold for follow-up of symptoms worsen Positive straight leg raise, symptoms do not improve would recommend MRI Exam reassuring with preserved strength and reflexes.   Meds ordered this encounter  Medications  . predniSONE (DELTASONE) 20 MG tablet    Sig: Take 2 tablets (40 mg total) by mouth daily with breakfast.    Dispense:  14 tablet    Refill:  0    Laroy Apple, MD [facility] 08/26/2016, 10:35 AM

## 2016-09-15 ENCOUNTER — Telehealth: Payer: Self-pay

## 2016-09-15 NOTE — Telephone Encounter (Signed)
Patient daughter aware of recommendation.

## 2016-09-15 NOTE — Telephone Encounter (Signed)
I would likely pursue MRI or refer to a spine doctor, however we need to follow up to do these.   Needs appt, ASAP  Laroy Apple, MD [facility] 09/15/2016, 12:10 PM

## 2016-09-15 NOTE — Telephone Encounter (Signed)
Patient was seen 08/26/16 for sciatica of left side. Daughter states that the pain has gotten worse and he can no longer walk but 50 feet at a time. Wanting to know what the next step is. Please contact the daughter. Please advise

## 2016-09-29 ENCOUNTER — Encounter: Payer: Self-pay | Admitting: Family Medicine

## 2016-09-29 ENCOUNTER — Ambulatory Visit (INDEPENDENT_AMBULATORY_CARE_PROVIDER_SITE_OTHER): Payer: Medicare Other

## 2016-09-29 ENCOUNTER — Ambulatory Visit (INDEPENDENT_AMBULATORY_CARE_PROVIDER_SITE_OTHER): Payer: Medicare Other | Admitting: Family Medicine

## 2016-09-29 VITALS — BP 154/76 | HR 83 | Temp 97.5°F | Ht 73.0 in | Wt 174.8 lb

## 2016-09-29 DIAGNOSIS — M25552 Pain in left hip: Secondary | ICD-10-CM

## 2016-09-29 MED ORDER — GABAPENTIN 100 MG PO CAPS: 100.0000 mg | ORAL_CAPSULE | Freq: Three times a day (TID) | ORAL | 3 refills | Status: DC

## 2016-09-29 NOTE — Progress Notes (Signed)
   HPI  Patient presents today here for follow-up back pain.  Patient has continued pain, and has improved. He now describes it as more of the left lateral hip pain that radiates to the left lateral knee. He states that it's worse with walking. His wife states that he's had a difficult time walking and has been holding onto walls is walking. He's tried gabapentin at home with very good improvement.  He denies any leg weakness, numbness, or tingling. He denies any back pain.  PMH: Smoking status noted ROS: Per HPI  Objective: BP (!) 154/76   Pulse 83   Temp (!) 97.5 F (36.4 C) (Oral)   Ht 6\' 1"  (1.854 m)   Wt 174 lb 12.8 oz (79.3 kg)   BMI 23.06 kg/m  Gen: NAD, alert, cooperative with exam HEENT: NCAT, EOMI, PERRL CV: RRR, good S1/S2, no murmur Resp: CTABL, no wheezes, non-labored Ext: No edema, warm Neuro: Alert and oriented, No gross deficits MSK Tenderness to palpation above the left greater trochanter, patient is rubbing the left IT band area throughout the exam. Modified straight leg raise has pain with the left lateral hip, not the low back and no radiation down the leg.  Assessment and plan:  # Left hip pain Continued, however improved I suspect this could be multifactorial with some component of IT band syndrome. He had improvement with gabapentin at home, trial gabapentin. Consider sports medicine referral if persistent, I am more doubtful that this is true sciatica and a low back problem at this time.      Orders Placed This Encounter  Procedures  . DG HIP UNILAT W OR W/O PELVIS 2-3 VIEWS LEFT    Standing Status:   Future    Standing Expiration Date:   09/29/2017    Order Specific Question:   Reason for Exam (SYMPTOM  OR DIAGNOSIS REQUIRED)    Answer:   L lat hip pain X 1 month    Order Specific Question:   Preferred imaging location?    Answer:   Internal    Meds ordered this encounter  Medications  . gabapentin (NEURONTIN) 100 MG capsule    Sig:  Take 1 capsule (100 mg total) by mouth 3 (three) times daily.    Dispense:  90 capsule    Refill:  [place], MD [facility] 09/29/2016, 2:21 PM

## 2016-09-29 NOTE — Patient Instructions (Signed)
Great to see you!  Come back in 4-6 weeks unless you need [place] sooner.   Try the stretches in the handout, try gabapentin.

## 2016-10-13 ENCOUNTER — Other Ambulatory Visit: Payer: Self-pay

## 2017-01-14 DIAGNOSIS — H26492 Other secondary cataract, left eye: Secondary | ICD-10-CM | POA: Diagnosis not present

## 2017-01-14 DIAGNOSIS — Z961 Presence of intraocular lens: Secondary | ICD-10-CM | POA: Diagnosis not present

## 2017-05-10 ENCOUNTER — Encounter (HOSPITAL_COMMUNITY): Payer: Self-pay | Admitting: Internal Medicine

## 2017-05-10 ENCOUNTER — Other Ambulatory Visit: Payer: Self-pay

## 2017-05-10 ENCOUNTER — Inpatient Hospital Stay (HOSPITAL_COMMUNITY)
Admission: AD | Admit: 2017-05-10 | Discharge: 2017-05-12 | DRG: 378 | Disposition: A | Discharge status: Home / Self Care | Payer: Medicare Other | Source: Other Acute Inpatient Hospital | Attending: Internal Medicine | Admitting: Internal Medicine

## 2017-05-10 DIAGNOSIS — K31819 Angiodysplasia of stomach and duodenum without bleeding: Secondary | ICD-10-CM | POA: Diagnosis not present

## 2017-05-10 DIAGNOSIS — K31811 Angiodysplasia of stomach and duodenum with bleeding: Secondary | ICD-10-CM | POA: Diagnosis present

## 2017-05-10 DIAGNOSIS — K922 Gastrointestinal hemorrhage, unspecified: Secondary | ICD-10-CM | POA: Diagnosis not present

## 2017-05-10 DIAGNOSIS — F1010 Alcohol abuse, uncomplicated: Secondary | ICD-10-CM | POA: Diagnosis present

## 2017-05-10 DIAGNOSIS — I700 Atherosclerosis of aorta: Secondary | ICD-10-CM | POA: Diagnosis not present

## 2017-05-10 DIAGNOSIS — H409 Unspecified glaucoma: Secondary | ICD-10-CM | POA: Diagnosis present

## 2017-05-10 DIAGNOSIS — Z79899 Other long term (current) drug therapy: Secondary | ICD-10-CM

## 2017-05-10 DIAGNOSIS — G4489 Other headache syndrome: Secondary | ICD-10-CM | POA: Diagnosis not present

## 2017-05-10 DIAGNOSIS — F17210 Nicotine dependence, cigarettes, uncomplicated: Secondary | ICD-10-CM | POA: Diagnosis present

## 2017-05-10 DIAGNOSIS — Z716 Tobacco abuse counseling: Secondary | ICD-10-CM

## 2017-05-10 DIAGNOSIS — K3189 Other diseases of stomach and duodenum: Secondary | ICD-10-CM | POA: Diagnosis not present

## 2017-05-10 DIAGNOSIS — Z91018 Allergy to other foods: Secondary | ICD-10-CM

## 2017-05-10 DIAGNOSIS — K319 Disease of stomach and duodenum, unspecified: Secondary | ICD-10-CM | POA: Diagnosis present

## 2017-05-10 DIAGNOSIS — Z720 Tobacco use: Secondary | ICD-10-CM | POA: Diagnosis not present

## 2017-05-10 DIAGNOSIS — J449 Chronic obstructive pulmonary disease, unspecified: Secondary | ICD-10-CM | POA: Diagnosis present

## 2017-05-10 DIAGNOSIS — Z961 Presence of intraocular lens: Secondary | ICD-10-CM | POA: Diagnosis present

## 2017-05-10 DIAGNOSIS — D62 Acute posthemorrhagic anemia: Secondary | ICD-10-CM | POA: Diagnosis present

## 2017-05-10 DIAGNOSIS — K8020 Calculus of gallbladder without cholecystitis without obstruction: Secondary | ICD-10-CM | POA: Diagnosis not present

## 2017-05-10 DIAGNOSIS — R509 Fever, unspecified: Secondary | ICD-10-CM | POA: Diagnosis not present

## 2017-05-10 DIAGNOSIS — R42 Dizziness and giddiness: Secondary | ICD-10-CM | POA: Diagnosis not present

## 2017-05-10 DIAGNOSIS — R404 Transient alteration of awareness: Secondary | ICD-10-CM | POA: Diagnosis not present

## 2017-05-10 LAB — CBC WITH DIFFERENTIAL/PLATELET
Basophils Absolute: 0.1 10*3/uL (ref 0.0–0.1)
Basophils Relative: 1 %
Eosinophils Absolute: 0.1 10*3/uL (ref 0.0–0.7)
Eosinophils Relative: 1 %
HEMATOCRIT: 25.9 % — AB (ref 39.0–52.0)
HEMOGLOBIN: 8.4 g/dL — AB (ref 13.0–17.0)
LYMPHS ABS: 1.4 10*3/uL (ref 0.7–4.0)
LYMPHS PCT: 23 %
MCH: 29.3 pg (ref 26.0–34.0)
MCHC: 32.4 g/dL (ref 30.0–36.0)
MCV: 90.2 fL (ref 78.0–100.0)
MONO ABS: 0.8 10*3/uL (ref 0.1–1.0)
MONOS PCT: 13 %
NEUTROS ABS: 3.7 10*3/uL (ref 1.7–7.7)
NEUTROS PCT: 62 %
Platelets: 163 10*3/uL (ref 150–400)
RBC: 2.87 MIL/uL — ABNORMAL LOW (ref 4.22–5.81)
RDW: 17.4 % — AB (ref 11.5–15.5)
WBC: 6 10*3/uL (ref 4.0–10.5)

## 2017-05-10 LAB — COMPREHENSIVE METABOLIC PANEL
ALK PHOS: 47 U/L (ref 38–126)
ALT: 5 U/L — ABNORMAL LOW (ref 17–63)
ANION GAP: 5 (ref 5–15)
AST: 15 U/L (ref 15–41)
Albumin: 2.8 g/dL — ABNORMAL LOW (ref 3.5–5.0)
BILIRUBIN TOTAL: 0.8 mg/dL (ref 0.3–1.2)
BUN: 15 mg/dL (ref 6–20)
CALCIUM: 8.3 mg/dL — AB (ref 8.9–10.3)
CO2: 23 mmol/L (ref 22–32)
Chloride: 107 mmol/L (ref 101–111)
Creatinine, Ser: 0.93 mg/dL (ref 0.61–1.24)
GFR calc Af Amer: >60 mL/min (ref >60)
GLUCOSE: 107 mg/dL — AB (ref 65–99)
POTASSIUM: 4.5 mmol/L (ref 3.5–5.1)
Sodium: 135 mmol/L (ref 135–145)
TOTAL PROTEIN: 5.1 g/dL — AB (ref 6.5–8.1)

## 2017-05-10 LAB — PROTIME-INR
INR: 0.99
PROTHROMBIN TIME: 13 s (ref 11.4–15.2)

## 2017-05-10 LAB — PHOSPHORUS: Phosphorus: 2.3 mg/dL — ABNORMAL LOW (ref 2.5–4.6)

## 2017-05-10 LAB — MAGNESIUM: MAGNESIUM: 2 mg/dL (ref 1.7–2.4)

## 2017-05-10 MED ORDER — LORAZEPAM 2 MG/ML IJ SOLN: 0.0000 mg | Freq: Two times a day (BID) | INTRAMUSCULAR | Status: DC

## 2017-05-10 MED ORDER — FOLIC ACID 1 MG PO TABS
1.0000 mg | ORAL_TABLET | Freq: Every day | ORAL | Status: DC
  Administered 2017-05-11 – 2017-05-12 (×2): 1 mg via ORAL
  Filled 2017-05-10 (×2): qty 1

## 2017-05-10 MED ORDER — NICOTINE 21 MG/24HR TD PT24
21.0000 mg | MEDICATED_PATCH | Freq: Every day | TRANSDERMAL | Status: DC | PRN
  Administered 2017-05-11: 21 mg via TRANSDERMAL
  Filled 2017-05-10: qty 1

## 2017-05-10 MED ORDER — LORAZEPAM 2 MG/ML IJ SOLN: 1.0000 mg | Freq: Four times a day (QID) | INTRAMUSCULAR | Status: DC | PRN

## 2017-05-10 MED ORDER — ONDANSETRON HCL 4 MG PO TABS: 4.0000 mg | ORAL_TABLET | Freq: Four times a day (QID) | ORAL | Status: DC | PRN

## 2017-05-10 MED ORDER — ONDANSETRON HCL 4 MG/2ML IJ SOLN: 4.0000 mg | Freq: Four times a day (QID) | INTRAMUSCULAR | Status: DC | PRN

## 2017-05-10 MED ORDER — THIAMINE HCL 100 MG/ML IJ SOLN
100.0000 mg | Freq: Every day | INTRAMUSCULAR | Status: DC
  Filled 2017-05-10: qty 2

## 2017-05-10 MED ORDER — VITAMIN B-1 100 MG PO TABS
100.0000 mg | ORAL_TABLET | Freq: Every day | ORAL | Status: DC
  Administered 2017-05-11 – 2017-05-12 (×2): 100 mg via ORAL
  Filled 2017-05-10 (×2): qty 1

## 2017-05-10 MED ORDER — ADULT MULTIVITAMIN W/MINERALS CH
1.0000 | ORAL_TABLET | Freq: Every day | ORAL | Status: DC
  Administered 2017-05-11 – 2017-05-12 (×2): 1 via ORAL
  Filled 2017-05-10 (×2): qty 1

## 2017-05-10 MED ORDER — LORAZEPAM 2 MG/ML IJ SOLN: 0.0000 mg | Freq: Four times a day (QID) | INTRAMUSCULAR | Status: DC

## 2017-05-10 MED ORDER — LORAZEPAM 1 MG PO TABS: 1.0000 mg | ORAL_TABLET | Freq: Four times a day (QID) | ORAL | Status: DC | PRN

## 2017-05-10 NOTE — H&P (Signed)
History and Physical    Thomas Davenport IOX:[id number] DOB: 20-May-1945 DOA: 05/10/2017  PCP: Timmothy Euler, MD   Patient coming from: [facility]  I have personally briefly reviewed patient's old medical records in [place]  Chief Complaint: Anemia and positive heme stool.  HPI: Thomas Davenport is a 72 y.o. male with medical history significant of COPD, glaucoma, alcohol abuse, tobacco use who is being transferred from [facility] due to symptomatic anemia with positive occult blood in stool.  Per patient's daughter, yesterday he was now feeling well.  He became lightheaded and mildly diaphoretic around 1915 on Sunday and went to bed early.  He woke up around 0530 today with worse lightheadedness and profuse diaphoresis.  He denies chest pain, palpitations, dyspnea, PND, orthopnea or pitting edema of the lower extremities.  No recent wheezing or hemoptysis.  Denies abdominal pain, nausea, emesis, diarrhea, constipation or hematochezia.  His stools have been dark.  No dysuria, frequency or hematuria.  ED Course: Not applied.  Please see transfer documents from transferring facility.  Review of Systems: As per HPI otherwise 10 point review of systems negative.    Past Medical History:  Diagnosis Date  . COPD (chronic obstructive pulmonary disease) ([place])   . Glaucoma    left eyes surgical correction 2013    Past Surgical History:  Procedure Laterality Date  . CATARACT EXTRACTION W/PHACO  08/31/2011   Procedure: CATARACT EXTRACTION PHACO AND INTRAOCULAR LENS PLACEMENT (IOC);  Surgeon: Tonny Branch, MD;  Location: [facility];  Service: Ophthalmology;  Laterality: Left;  CDE:22.56  . CATARACT EXTRACTION W/PHACO  09/14/2011   Procedure: CATARACT EXTRACTION PHACO AND INTRAOCULAR LENS PLACEMENT (IOC);  Surgeon: Tonny Branch, MD;  Location: [facility];  Service: Ophthalmology;  Laterality: Right;  CDE:24.60      reports that he has been smoking cigarettes.  He has a 67.50 pack-year  smoking history. he has never used smokeless tobacco. He reports that he drinks about 42.0 oz of alcohol per week. He reports that he does not use drugs.  Allergies  Allergen Reactions  . Pineapple Nausea And Vomiting    Family History  Problem Relation Age of Onset  . Tuberculosis Father   . Cancer Brother        lung  . Heart attack Brother 74    Prior to Admission medications   Medication Sig Start Date End Date Taking? Authorizing Provider  fexofenadine (ALLEGRA) 180 MG tablet Take 180 mg by mouth daily as needed (sinus issues). Reported on 07/22/2015    [provider]  gabapentin (NEURONTIN) 100 MG capsule Take 1 capsule (100 mg total) by mouth 3 (three) times daily. 09/29/16   Timmothy Euler, MD  Multiple Vitamin (MULTIVITAMIN WITH MINERALS) TABS tablet Take 1 tablet by mouth daily. 03/08/14   Barton Dubois, MD    Physical Exam: Vitals:   05/10/17 2049 05/10/17 2115  BP: (!) 143/61   Pulse: 76   Resp: 20   Temp: (!) 97.5 F (36.4 C)   TempSrc: Oral   SpO2: 97% 94%  Weight: 75.8 kg (167 lb 1.7 oz)     Constitutional: NAD, calm, comfortable Eyes: PERRL, lids and conjunctivae normal ENMT: Mucous membranes are moist. Posterior pharynx clear of any exudate or lesions. Neck: normal, supple, no masses, no thyromegaly Respiratory: clear to auscultation bilaterally, no wheezing, no crackles. Normal respiratory effort. No accessory muscle use.  Cardiovascular: Regular rate and rhythm, no murmurs / rubs / gallops. No extremity edema. 2+  pedal pulses. No carotid bruits.  Abdomen: Soft, no tenderness, no masses palpated. No hepatosplenomegaly. Bowel sounds positive.  Musculoskeletal: no clubbing / cyanosis.  Good ROM, no contractures. Normal muscle tone.  Skin: Small ecchymosis areas from venipunctures. Neurologic: CN 2-12 grossly intact. Sensation intact, DTR normal. Strength 5/5 in all 4.  Psychiatric:  Alert and oriented x 3. Normal mood.    Labs on Admission:  I have personally reviewed following labs and imaging studies  CBC: No results for input(s): WBC, NEUTROABS, HGB, HCT, MCV, PLT in the last 168 hours. Basic Metabolic Panel: No results for input(s): NA, K, CL, CO2, GLUCOSE, BUN, CREATININE, CALCIUM, MG, PHOS in the last 168 hours. GFR: CrCl cannot be calculated (Patient's most recent lab result is older than the maximum 21 days allowed.). Liver Function Tests: No results for input(s): AST, ALT, ALKPHOS, BILITOT, PROT, ALBUMIN in the last 168 hours. No results for input(s): LIPASE, AMYLASE in the last 168 hours. No results for input(s): AMMONIA in the last 168 hours. Coagulation Profile: No results for input(s): INR, PROTIME in the last 168 hours. Cardiac Enzymes: No results for input(s): CKTOTAL, CKMB, CKMBINDEX, TROPONINI in the last 168 hours. BNP (last 3 results) No results for input(s): PROBNP in the last 8760 hours. HbA1C: No results for input(s): HGBA1C in the last 72 hours. CBG: No results for input(s): GLUCAP in the last 168 hours. Lipid Profile: No results for input(s): CHOL, HDL, LDLCALC, TRIG, CHOLHDL, LDLDIRECT in the last 72 hours. Thyroid Function Tests: No results for input(s): TSH, T4TOTAL, FREET4, T3FREE, THYROIDAB in the last 72 hours. Anemia Panel: No results for input(s): VITAMINB12, FOLATE, FERRITIN, TIBC, IRON, RETICCTPCT in the last 72 hours. Urine analysis:    Component Value Date/Time   COLORURINE YELLOW 03/06/2014 2224   APPEARANCEUR CLEAR 03/06/2014 2224   LABSPEC 1.003 (L) 03/06/2014 2224   PHURINE 6.0 03/06/2014 2224   GLUCOSEU NEGATIVE 03/06/2014 2224   HGBUR NEGATIVE 03/06/2014 2224   BILIRUBINUR NEGATIVE 03/06/2014 2224   KETONESUR NEGATIVE 03/06/2014 2224   PROTEINUR NEGATIVE 03/06/2014 2224   UROBILINOGEN 0.2 03/06/2014 2224   NITRITE NEGATIVE 03/06/2014 2224   LEUKOCYTESUR NEGATIVE 03/06/2014 2224    Radiological Exams on Admission: No results found.  EKG: Independently  reviewed.   Assessment/Plan Principal Problem:   UGI bleed Admit to telemetry/inpatient. Keep n.p.o. Continue Protonix 40 mg every 12 hours. Zofran as needed for nausea/emesis. Monitor hematocrit and hemoglobin. Dr. Laural Golden will be evaluating the patient in the morning.  Active Problems:   Glaucoma Per patient and daughter he is not using eye drops anymore. Patient stated that this has resolved after his eyes cataract surgery.    COPD (chronic obstructive pulmonary disease) (HCC) Asymptomatic at this time.    Alcohol abuse Per patient's daughter, he is drinking 3-4 beers a day. He was given Ativan at [facility] and became agitated. I had discontinue CiWA protocol and will just continue to monitor.    Tobacco abuse The replacement therapy ordered.    Hypophosphatemia Replacement ordered.   DVT prophylaxis: SCDs. Code Status: Full code. Family Communication: His daughter Santiago Glad was present in the room. Disposition Plan:  Consults called:  Admission status: Inpatient/telemetry.   Reubin Milan MD [facility] Pager [phone]  If 7PM-7AM, please contact night-coverage [email or web address] Password [facility]  05/10/2017, 10:45 PM

## 2017-05-11 ENCOUNTER — Other Ambulatory Visit: Payer: Self-pay

## 2017-05-11 ENCOUNTER — Encounter (HOSPITAL_COMMUNITY)
Admission: AD | Disposition: A | Discharge status: Home / Self Care | Payer: Self-pay | Source: Other Acute Inpatient Hospital | Attending: Internal Medicine

## 2017-05-11 DIAGNOSIS — D62 Acute posthemorrhagic anemia: Secondary | ICD-10-CM

## 2017-05-11 HISTORY — PX: ESOPHAGOGASTRODUODENOSCOPY: SHX5428

## 2017-05-11 LAB — COMPREHENSIVE METABOLIC PANEL
ALBUMIN: 2.9 g/dL — AB (ref 3.5–5.0)
ALK PHOS: 49 U/L (ref 38–126)
ALT: 8 U/L — AB (ref 17–63)
AST: 17 U/L (ref 15–41)
Anion gap: 8 (ref 5–15)
BUN: 12 mg/dL (ref 6–20)
CALCIUM: 8.3 mg/dL — AB (ref 8.9–10.3)
CO2: 21 mmol/L — AB (ref 22–32)
Chloride: 104 mmol/L (ref 101–111)
Creatinine, Ser: 0.79 mg/dL (ref 0.61–1.24)
GFR calc Af Amer: >60 mL/min (ref >60)
GFR calc non Af Amer: >60 mL/min (ref >60)
GLUCOSE: 94 mg/dL (ref 65–99)
Potassium: 3.5 mmol/L (ref 3.5–5.1)
Sodium: 133 mmol/L — ABNORMAL LOW (ref 135–145)
TOTAL PROTEIN: 5.2 g/dL — AB (ref 6.5–8.1)
Total Bilirubin: 0.7 mg/dL (ref 0.3–1.2)

## 2017-05-11 LAB — CBC
HCT: 26.8 % — ABNORMAL LOW (ref 39.0–52.0)
Hemoglobin: 8.9 g/dL — ABNORMAL LOW (ref 13.0–17.0)
MCH: 30 pg (ref 26.0–34.0)
MCHC: 33.2 g/dL (ref 30.0–36.0)
MCV: 90.2 fL (ref 78.0–100.0)
Platelets: 177 10*3/uL (ref 150–400)
RBC: 2.97 MIL/uL — ABNORMAL LOW (ref 4.22–5.81)
RDW: 17.8 % — AB (ref 11.5–15.5)
WBC: 5.3 10*3/uL (ref 4.0–10.5)

## 2017-05-11 LAB — HEMOGLOBIN AND HEMATOCRIT, BLOOD
HCT: 27.2 % — ABNORMAL LOW (ref 39.0–52.0)
Hemoglobin: 9 g/dL — ABNORMAL LOW (ref 13.0–17.0)

## 2017-05-11 SURGERY — EGD (ESOPHAGOGASTRODUODENOSCOPY)
Anesthesia: Moderate Sedation

## 2017-05-11 MED ORDER — LORAZEPAM 1 MG PO TABS: 1.0000 mg | ORAL_TABLET | Freq: Four times a day (QID) | ORAL | Status: DC | PRN

## 2017-05-11 MED ORDER — PANTOPRAZOLE SODIUM 40 MG IV SOLR
40.0000 mg | Freq: Two times a day (BID) | INTRAVENOUS | Status: DC
  Administered 2017-05-11 – 2017-05-12 (×3): 40 mg via INTRAVENOUS
  Filled 2017-05-11 (×3): qty 40

## 2017-05-11 MED ORDER — SUCRALFATE 1 GM/10ML PO SUSP
1.0000 g | Freq: Three times a day (TID) | ORAL | Status: DC
  Administered 2017-05-11 – 2017-05-12 (×4): 1 g via ORAL
  Filled 2017-05-11 (×4): qty 10

## 2017-05-11 MED ORDER — MEPERIDINE HCL 50 MG/ML IJ SOLN
INTRAMUSCULAR | Status: AC
  Filled 2017-05-11: qty 1

## 2017-05-11 MED ORDER — MIDAZOLAM HCL 5 MG/5ML IJ SOLN
INTRAMUSCULAR | Status: AC
  Filled 2017-05-11: qty 10

## 2017-05-11 MED ORDER — MIDAZOLAM HCL 5 MG/5ML IJ SOLN
INTRAMUSCULAR | Status: DC | PRN
  Administered 2017-05-11: 1 mg via INTRAVENOUS

## 2017-05-11 MED ORDER — K PHOS MONO-SOD PHOS DI & MONO 155-852-130 MG PO TABS
250.0000 mg | ORAL_TABLET | Freq: Two times a day (BID) | ORAL | Status: AC
  Administered 2017-05-11 (×2): 250 mg via ORAL
  Filled 2017-05-11 (×2): qty 1

## 2017-05-11 MED ORDER — LIDOCAINE VISCOUS 2 % MT SOLN
OROMUCOSAL | Status: AC
  Filled 2017-05-11: qty 15

## 2017-05-11 MED ORDER — LORAZEPAM 2 MG/ML IJ SOLN: 1.0000 mg | Freq: Four times a day (QID) | INTRAMUSCULAR | Status: DC | PRN

## 2017-05-11 MED ORDER — FOLIC ACID 1 MG PO TABS: 1.0000 mg | ORAL_TABLET | Freq: Every day | ORAL | Status: DC

## 2017-05-11 MED ORDER — MEPERIDINE HCL 50 MG/ML IJ SOLN
INTRAMUSCULAR | Status: DC | PRN
  Administered 2017-05-11 (×2): 25 mg

## 2017-05-11 MED ORDER — MIDAZOLAM HCL 5 MG/5ML IJ SOLN
INTRAMUSCULAR | Status: DC | PRN
  Administered 2017-05-11 (×2): 2 mg via INTRAVENOUS

## 2017-05-11 MED ORDER — LIDOCAINE VISCOUS 2 % MT SOLN
OROMUCOSAL | Status: DC | PRN
  Administered 2017-05-11: 1 via OROMUCOSAL

## 2017-05-11 MED ORDER — ADULT MULTIVITAMIN W/MINERALS CH: 1.0000 | ORAL_TABLET | Freq: Every day | ORAL | Status: DC

## 2017-05-11 MED ORDER — SODIUM CHLORIDE 0.9 % IV SOLN: INTRAVENOUS | Status: DC

## 2017-05-11 NOTE — Progress Notes (Signed)
Endoscopy staff arrived to transport patient for EGD.

## 2017-05-11 NOTE — Progress Notes (Signed)
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 Single prepyloric erosion.  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 Hemostasis obtained with hemospray.

## 2017-05-11 NOTE — Consult Note (Signed)
Referring Provider: Youlanda Roys. Olevia Bowens, MD  Primary Care Physician:  Timmothy Euler, MD Primary Gastroenterologist:  Dr. Laural Golden  Reason for Consultation:    GI bleed and anemia.  HPI:   Patient is 72 year old Caucasian male who was in usual state of health until early morning of 05/08/2017 when he woke up with profuse diaphoreses.  He did not have any other symptoms such as shortness of breath chest or abdominal pain.  He felt dizzy and had mild headache.  He felt better within 1-2 hours and went back to sleep.  He woke up again in the middle of night on 05/09/2017 with similar symptoms and these resolved spontaneously.  He has noted dark stool over the last few days but did not being intention.  According to his wife patient woke up at 5:14 AM yesterday with profuse sweating and felt weak and dizzy.  He asked his wife to call 911.  Patient was taken to ER at [place].  Patient's hemoglobin was around 6.3.  He was noted to have heme positive stool.  Patient was resuscitated with fluids.  He received 2 units of PRBCs.  Patient was subsequently transferred to this facility for further management. Patient states he did not have any diaphoretic spells last night.  He has not had a BM since yesterday.  He denies nausea vomiting dysphagia abdominal pain or rectal bleeding.  He has occasional heartburn with certain foods such as pizza.  He does not take OTC medications.  He drinks 4-5 cans of beer daily. He has good appetite and has not lost any weight. He states he had colonoscopy 4 or 5 years ago at [facility] and polyp was found.  He does not remember any further details.  He is married.  He is retired.  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  He retired in 2007. He smokes 1-1-1/2 pack/day which he has done for over 50 years.  He drinks 4-5 cans of beer every day.  He has never been treated for alcohol withdrawal or other problems associated with it.   He has 3 daughters.  1 of his daughters work in radiology department at this facility.  Both parents are disease.  Father was treated for TB and died of auto accident at age 15.  Mother lived to be 24.  He had 2 brothers and they are both disease.  One died of metastatic disease at age 74 and another one died of MI at age 31.  He has 3 sisters living and they are generally in good health.  One sister died of complications of surgery at age 18 and another had polio and died at 61.    Past Medical History:  Diagnosis Date  . COPD (chronic obstructive pulmonary disease) ([place])   . Glaucoma    left eyes surgical correction 2013      Colonoscopy few years ago with removal of a polyp.  Past Surgical History:  Procedure Laterality Date  . CATARACT EXTRACTION W/PHACO  08/31/2011   Procedure: CATARACT EXTRACTION PHACO AND INTRAOCULAR LENS PLACEMENT (IOC);  Surgeon: Tonny Branch, MD;  Location: [facility];  Service: Ophthalmology;  Laterality: Left;  CDE:22.56  . CATARACT EXTRACTION W/PHACO  09/14/2011   Procedure: CATARACT EXTRACTION PHACO AND INTRAOCULAR LENS PLACEMENT (IOC);  Surgeon: Tonny Branch, MD;  Location: [facility];  Service: Ophthalmology;  Laterality: Right;  CDE:24.60     Prior to Admission medications  Not on File    Current Facility-Administered Medications  Medication Dose Route Frequency Provider Last Rate Last Dose  . folic acid (FOLVITE) tablet 1 mg  1 mg Oral Daily Reubin Milan, MD      . multivitamin with minerals tablet 1 tablet  1 tablet Oral Daily Reubin Milan, MD      . nicotine (NICODERM CQ - dosed in mg/24 hours) patch 21 mg  21 mg Transdermal Daily PRN Reubin Milan, MD      . ondansetron [facility]) tablet 4 mg  4 mg Oral Q6H PRN Reubin Milan, MD       Or  . ondansetron [facility]) injection 4 mg  4 mg Intravenous Q6H PRN Reubin Milan, MD      . pantoprazole (PROTONIX) injection 40 mg  40 mg Intravenous Q12H Reubin Milan, MD      .  phosphorus (K PHOS NEUTRAL) tablet 250 mg  250 mg Oral BID Reubin Milan, MD      . thiamine (VITAMIN B-1) tablet 100 mg  100 mg Oral Daily Reubin Milan, MD       Or  . thiamine (B-1) injection 100 mg  100 mg Intravenous Daily Reubin Milan, MD        Allergies as of 05/10/2017 - Review Complete 05/10/2017  Allergen Reaction Noted  . Pineapple Nausea And Vomiting 08/24/2011    Family History  Problem Relation Age of Onset  . Tuberculosis Father   . Cancer Brother        lung  . Heart attack Brother 49    Social History   Socioeconomic History  . Marital status: Married    Spouse name: Not on file  . Number of children: Not on file  . Years of education: Not on file  . Highest education level: Not on file  Social Needs  . Financial resource strain: Not on file  . Food insecurity - worry: Not on file  . Food insecurity - inability: Not on file  . Transportation needs - medical: Not on file  . Transportation needs - non-medical: Not on file  Occupational History  . Not on file  Tobacco Use  . Smoking status: Current Every Day Smoker    Packs/day: 1.50    Years: 45.00    Pack years: 67.50    Types: Cigarettes  . Smokeless tobacco: Never Used  Substance and Sexual Activity  . Alcohol use: Yes    Alcohol/week: 42.0 oz    Types: 70 Cans of beer per week    Comment: about 10 cans of beer a day  . Drug use: No  . Sexual activity: Yes    Birth control/protection: None  Other Topics Concern  . Not on file  Social History Narrative  . Not on file    Review of Systems: See HPI, otherwise normal ROS  Physical Exam: Temp:  [97.5 F (36.4 C)-98.2 F (36.8 C)] 98.2 F (36.8 C) (02/19 0524) Pulse Rate:  [73-76] 73 (02/19 0524) Resp:  [20] 20 (02/19 0524) BP: (141-143)/(57-61) 141/57 (02/19 0524) SpO2:  [94 %-99 %] 99 % (02/19 0524) Weight:  [167 lb 1.7 oz (75.8 kg)] 167 lb 1.7 oz (75.8 kg) (02/18 2049) Last BM Date: 05/10/17  Patient is alert  and in no acute distress. Conjunctiva is pale.  Sclerae nonicteric. Oropharyngeal mucosa is normal.  He has upper and lower dentures in place. No neck masses or thyromegaly noted. Neck exam with  regular rhythm normal S1 and S2.  No murmur or gallop noted. Lungs are clear to auscultation. Abdomen is symmetrical.  Small umbilical hernia noted.  It is reducible.  Bowel sounds are normal.  On palpation abdomen is soft and nontender without organomegaly or masses. No peripheral edema or clubbing noted. He has multiple ecchymosis over both forearms.    Lab Results: Recent Labs    05/10/17 2238 05/11/17 0511  WBC 6.0 5.3  HGB 8.4* 8.9*  HCT 25.9* 26.8*  PLT 163 177   BMET Recent Labs    05/10/17 2238 05/11/17 0511  NA 135 133*  K 4.5 3.5  CL 107 104  CO2 23 21*  GLUCOSE 107* 94  BUN 15 12  CREATININE 0.93 0.79  CALCIUM 8.3* 8.3*   LFT Recent Labs    05/11/17 0511  PROT 5.2*  ALBUMIN 2.9*  AST 17  ALT 8*  ALKPHOS 49  BILITOT 0.7   PT/INR Recent Labs    05/10/17 2238  LABPROT 13.0  INR 0.99    Assessment;  Patient is 72 year old Caucasian male who presents with symptomatic anemia and he has received 2 units of PRBCs at [facility] prior to transfer.  He has had dark stools over the last 3 days.  He has no other GI symptoms.  He drinks 4-5 cans of beer daily but he does not have stigmata of chronic liver disease or hepatic injury. Source of GI bleed is possibly UGI.  Need to rule out peptic ulcer disease as well as angioid dysplasia given no history of abdominal pain or NSAID use.  Doubt variceal bleed. Patient is hemodynamically stable. He is on IV pantoprazole.   Recommendations;  Esophagogastroduodenoscopy this morning. I have reviewed the procedure as the patient and he is agreeable. Last colonoscopy records from [facility]. Further recommendations to follow.   LOS: 1 day   Najeeb Rehman  05/11/2017, 8:01 AM

## 2017-05-11 NOTE — Op Note (Signed)
[facility] Patient Name: Thomas Davenport Procedure Date: 05/11/2017 8:02 AM MRN: [id number] Date of Birth: February 19, 1946 Attending MD: Hildred Laser , MD CSN: [id number] Age: 72 Admit Type: Inpatient Procedure:                Upper GI endoscopy Indications:              Acute post hemorrhagic anemia Providers:                Hildred Laser, MD, Janeece Riggers, RN Referring MD:             Gevena Barre, MD Medicines:                Lidocaine spray, Meperidine 50 mg IV, Midazolam 5                            mg IV Complications:            No immediate complications. Estimated Blood Loss:     Estimated blood loss was minimal. Procedure:                Pre-Anesthesia Assessment:                           - Prior to the procedure, a History and Physical                            was performed, and patient medications and                            allergies were reviewed. The patient's tolerance of                            previous anesthesia was also reviewed. The risks                            and benefits of the procedure and the sedation                            options and risks were discussed with the patient.                            All questions were answered, and informed consent                            was obtained. Prior Anticoagulants: The patient has                            taken no previous anticoagulant or antiplatelet                            agents. ASA Grade Assessment: II - A patient with                            mild systemic disease. After reviewing the risks  and benefits, the patient was deemed in                            satisfactory condition to undergo the procedure.                           After obtaining informed consent, the endoscope was                            passed under direct vision. Throughout the                            procedure, the patient's blood pressure, pulse, and   oxygen saturations were monitored continuously. The                            [id number]) was introduced through the mouth,                            and advanced to the second part of duodenum. The                            upper GI endoscopy was accomplished without                            difficulty. The patient tolerated the procedure                            well. Scope In: 8:34:30 AM Scope Out: 8:50:08 AM Total Procedure Duration: 0 hours 15 minutes 38 seconds  Findings:      The examined esophagus was normal.      The Z-line was regular and was found 43 cm from the incisors.      A single 8 mm no bleeding angiodysplastic lesion was found in the       gastric body and on the lesser curvature of the stomach. Coagulation for       bleeding prevention using argon plasma was unsuccessful. Lesion sprayed       with Hemospray with hemostasis.      A single, non-bleeding erosion was found in the prepyloric region of the       stomach.      The exam of the stomach was otherwise normal.      The duodenal bulb and second portion of the duodenum were normal. Impression:               - Normal esophagus.                           - Z-line regular, 43 cm from the incisors.                           - A single non-bleeding angiodysplastic lesion in                            the stomach. Treatment not successful. Treated with  argon plasma coagulation (APC).                           - Non-bleeding erosive gastropathy.                           - Normal duodenal bulb and second portion of the                            duodenum.                           - No specimens collected. Moderate Sedation:      Moderate (conscious) sedation was administered by the endoscopy nurse       and supervised by the endoscopist. The following parameters were       monitored: oxygen saturation, heart rate, blood pressure, CO2       capnography and response to care.  Total physician intraservice time was       20 minutes. Recommendation:           - Return patient to hospital ward for ongoing care.                           - Clear liquid diet today.                           - Continue present medications.                           - Perform an H. pylori serology.                           - Carafate 1 g po ac and qhs. Procedure Code(s):        --- Professional ---                           [phone], Esophagogastroduodenoscopy, flexible,                            transoral; with control of bleeding, any method                           99152, Moderate sedation services provided by the                            same physician or other qualified health care                            professional performing the diagnostic or                            therapeutic service that the sedation supports,                            requiring the presence of an independent trained  observer to assist in the monitoring of the                            patient's level of consciousness and physiological                            status; initial 15 minutes of intraservice time,                            patient age 56 years or older Diagnosis Code(s):        --- Professional ---                           K31.819, Angiodysplasia of stomach and duodenum                            without bleeding                           K31.89, Other diseases of stomach and duodenum                           D62, Acute posthemorrhagic anemia CPT copyright 2016 American Medical Association. All rights reserved. The codes documented in this report are preliminary and upon coder review may  be revised to meet current compliance requirements. Hildred Laser, MD Hildred Laser, MD 05/11/2017 9:05:23 AM This report has been signed electronically. Number of Addenda: 0

## 2017-05-11 NOTE — Progress Notes (Signed)
PROGRESS NOTE                                                                                                                                                                                                             Patient Demographics:    Thomas Davenport, is a 72 y.o. male, DOB - [birth year], UYQ:[id number]  Admit date - 05/10/2017   Admitting Physician Debbe Odea, MD  Outpatient Primary MD for the patient is Timmothy Euler, MD  LOS - 1  Outpatient Specialists: None  No chief complaint on file.      Brief Narrative   72 year old male with history of COPD, ongoing alcohol and tobacco use transferred from [facility] rocking him due to symptomatic anemia (lightheadedness, diaphoresis) with positive Hemoccult in stool. Patient had hemoglobin of 8.4 and was admitted for acute upper GI bleed. He underwent EGD this morning which showed " A single 8 mm non bleeding angiodysplastic lesion was found in the gastric body and on the lesser curvature of the stomach. Coagulation for bleeding prevention using argon plasma was unsuccessful.Lesion sprayed with Hemospray with hemostasis.  Also a single nonbleeding erosion found in the prepyloric region.    Subjective:   Seen after returning from endoscopy.  Denies any pain or discomfort.   Assessment  & Plan :    Principal Problem:   UGI bleed Secondary to angiodysplastic lesion and prepyloric erosion.  Ongoing tobacco and alcohol use likely contributing.  GI recommends to continue PPI and Carafate.  H. pylori serology sent.  Needs repeat EGD as outpatient in few weeks to evaluate for healing. Serial H&H.  Active Problems: Alcohol abuse Drinks 5-6 beers per day.  Monitor on CIWA.  Counseled strongly on cessation.  Tobacco abuse Smokes 1 and half packs/day.  Counseled strongly on cessation.  Nicotine patch ordered.     COPD (chronic obstructive pulmonary disease) (HCC) Stable.   Not on any home medications.  Counseled strongly on smoking cessation.  Hypophosphatemia Replenished     Code Status : Full code  Family Communication  : Wife at bedside  Disposition Plan  : Home possibly tomorrow if H&H stable  Barriers For Discharge : Active symptoms  Consults  : GI  Procedures  : EGD  DVT Prophylaxis  : SCDs  Lab Results  Component Value Date   PLT 177 05/11/2017  Antibiotics  :    Anti-infectives (From admission, onward)   None        Objective:   Vitals:   05/11/17 0845 05/11/17 0850 05/11/17 0855 05/11/17 0900  BP: 136/65 132/62 (!) 124/59   Pulse: 68 66 63 62  Resp: 13 14 14 14   Temp:      TempSrc:      SpO2: 99% 98% 100% 100%  Weight:      Height:        Wt Readings from Last 3 Encounters:  05/10/17 75.8 kg (167 lb 1.7 oz)  09/29/16 79.3 kg (174 lb 12.8 oz)  08/26/16 81.1 kg (178 lb 12.8 oz)     Intake/Output Summary (Last 24 hours) at 05/11/2017 1001 Last data filed at 05/11/2017 0900 Gross per 24 hour  Intake 200 ml  Output 300 ml  Net -100 ml     Physical Exam  Gen: not in distress HEENT: Pallor present moist mucosa, supple neck Chest: clear b/l, no added sounds CVS: N S1&S2, no murmurs, GI: soft, NT, ND, BS+ Musculoskeletal: warm, no edema     Data Review:    CBC Recent Labs  Lab 05/10/17 2238 05/11/17 0511  WBC 6.0 5.3  HGB 8.4* 8.9*  HCT 25.9* 26.8*  PLT 163 177  MCV 90.2 90.2  MCH 29.3 30.0  MCHC 32.4 33.2  RDW 17.4* 17.8*  LYMPHSABS 1.4  --   MONOABS 0.8  --   EOSABS 0.1  --   BASOSABS 0.1  --     Chemistries  Recent Labs  Lab 05/10/17 2238 05/11/17 0511  NA 135 133*  K 4.5 3.5  CL 107 104  CO2 23 21*  GLUCOSE 107* 94  BUN 15 12  CREATININE 0.93 0.79  CALCIUM 8.3* 8.3*  MG 2.0  --   AST 15 17  ALT 5* 8*  ALKPHOS 47 49  BILITOT 0.8 0.7   ------------------------------------------------------------------------------------------------------------------ No results for  input(s): CHOL, HDL, LDLCALC, TRIG, CHOLHDL, LDLDIRECT in the last 72 hours.  No results found for: HGBA1C ------------------------------------------------------------------------------------------------------------------ No results for input(s): TSH, T4TOTAL, T3FREE, THYROIDAB in the last 72 hours.  Invalid input(s): FREET3 ------------------------------------------------------------------------------------------------------------------ No results for input(s): VITAMINB12, FOLATE, FERRITIN, TIBC, IRON, RETICCTPCT in the last 72 hours.  Coagulation profile Recent Labs  Lab 05/10/17 2238  INR 0.99    No results for input(s): DDIMER in the last 72 hours.  Cardiac Enzymes No results for input(s): CKMB, TROPONINI, MYOGLOBIN in the last 168 hours.  Invalid input(s): CK ------------------------------------------------------------------------------------------------------------------ No results found for: BNP  Inpatient Medications  Scheduled Meds: . lidocaine      . meperidine      . midazolam      . folic acid  1 mg Oral Daily  . multivitamin with minerals  1 tablet Oral Daily  . pantoprazole (PROTONIX) IV  40 mg Intravenous Q12H  . phosphorus  250 mg Oral BID  . sucralfate  1 g Oral TID WC & HS  . thiamine  100 mg Oral Daily   Or  . thiamine  100 mg Intravenous Daily   Continuous Infusions: PRN Meds:.nicotine, ondansetron **OR** ondansetron (ZOFRAN) IV  Micro Results No results found for this or any previous visit (from the past 240 hour(s)).  Radiology Reports No results found.  Time Spent in minutes  25   Aminata Buffalo M.D on 05/11/2017 at 10:01 AM  Between 7am to 7pm - Pager - [phone]  After 7pm go to [email or web address] - password [facility]  [facility] -  Office  [phone]

## 2017-05-12 DIAGNOSIS — K31819 Angiodysplasia of stomach and duodenum without bleeding: Secondary | ICD-10-CM

## 2017-05-12 DIAGNOSIS — K3189 Other diseases of stomach and duodenum: Secondary | ICD-10-CM

## 2017-05-12 DIAGNOSIS — Z720 Tobacco use: Secondary | ICD-10-CM

## 2017-05-12 DIAGNOSIS — F1010 Alcohol abuse, uncomplicated: Secondary | ICD-10-CM

## 2017-05-12 DIAGNOSIS — K922 Gastrointestinal hemorrhage, unspecified: Secondary | ICD-10-CM

## 2017-05-12 DIAGNOSIS — D62 Acute posthemorrhagic anemia: Secondary | ICD-10-CM

## 2017-05-12 LAB — HEMOGLOBIN AND HEMATOCRIT, BLOOD
HCT: 26.7 % — ABNORMAL LOW (ref 39.0–52.0)
Hemoglobin: 8.7 g/dL — ABNORMAL LOW (ref 13.0–17.0)

## 2017-05-12 LAB — H. PYLORI ANTIBODY, IGG: H Pylori IgG: <0.8 Index Value (ref 0.00–0.79)

## 2017-05-12 MED ORDER — FOLIC ACID 1 MG PO TABS: 1.0000 mg | ORAL_TABLET | Freq: Every day | ORAL | 1 refills | Status: DC

## 2017-05-12 MED ORDER — PANTOPRAZOLE SODIUM 40 MG PO TBEC: 40.0000 mg | DELAYED_RELEASE_TABLET | Freq: Two times a day (BID) | ORAL | 1 refills | Status: DC

## 2017-05-12 MED ORDER — SUCRALFATE 1 GM/10ML PO SUSP: 1.0000 g | Freq: Three times a day (TID) | ORAL | 1 refills | Status: DC

## 2017-05-12 MED ORDER — NICOTINE 21 MG/24HR TD PT24: 21.0000 mg | MEDICATED_PATCH | Freq: Every day | TRANSDERMAL | 0 refills | Status: DC | PRN

## 2017-05-12 MED ORDER — ADULT MULTIVITAMIN W/MINERALS CH: 1.0000 | ORAL_TABLET | Freq: Every day | ORAL | 1 refills | Status: DC

## 2017-05-12 MED ORDER — THIAMINE HCL 100 MG PO TABS: 100.0000 mg | ORAL_TABLET | Freq: Every day | ORAL | 1 refills | Status: DC

## 2017-05-12 NOTE — Care Management Important Message (Signed)
Important Message  Patient Details  Name: Thomas Davenport MRN: [id number] Date of Birth: May 23, 1945   Medicare Important Message Given:  Yes    Sherald Barge, RN 05/12/2017, 11:25 AM

## 2017-05-12 NOTE — Discharge Summary (Signed)
Physician Discharge Summary  Thomas Davenport RDE:[id number] DOB: May 08, 1945 DOA: 05/10/2017  PCP: Timmothy Euler, MD  Admit date: 05/10/2017 Discharge date: 05/12/2017  Time spent: 35 minutes  Recommendations for Outpatient Follow-up:  Repeat CBC to follow Hgb trend  Repeat BMET to follow PO4 electrolytes and renal function trend   Discharge Diagnoses:  Principal Problem:   UGI bleed Active Problems:   Glaucoma   COPD (chronic obstructive pulmonary disease) ([place])   Tobacco abuse   Alcohol abuse   Hypophosphatemia   Acute blood loss anemia   Discharge Condition: stable and improved. Discharge home with instructions to follow up with PCP and GI as an outpatient   Diet recommendation: regular diet   Filed Weights   05/10/17 2049  Weight: 75.8 kg (167 lb 1.7 oz)    History of present illness:  As per H&P written by Dr. Olevia Bowens on 05/10/17 72 y.o. male with medical history significant of COPD, glaucoma, alcohol abuse, tobacco use who is being transferred from [facility] due to symptomatic anemia with positive occult blood in stool. Per patient's daughter, yesterday he was now feeling well.  He became lightheaded and mildly diaphoretic around 1915 on Sunday and went to bed early.  He woke up around 0530 today with worse lightheadedness and profuse diaphoresis.  He denies chest pain, palpitations, dyspnea, PND, orthopnea or pitting edema of the lower extremities.  No recent wheezing or hemoptysis.  Denies abdominal pain, nausea, emesis, diarrhea, constipation or hematochezia.  His stools have been dark.  No dysuria, frequency or hematuria.  Hospital Course:  1-Acute blood loss anemia: due to upper GIB. 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 AVM was treated with APC, started to bleed after procedure, but hemostasis obtained with hemospray.  Consultations:  GI   Discharge Exam: Vitals:   05/11/17 2321 05/12/17 0700  BP: (!) 126/54 (!) 139/51  Pulse: 76 78  Resp: 16 15  Temp: 98.4 F (36.9 C) 98.4 F (36.9 C)  SpO2: 98% 97%    General:afebrile, no CP, no nausea, no vomiting, no further signs of active bleeding  Cardiovascular: S1 and S2, no rubs, no gallops, no JVD Respiratory: good air movement bilaterally, no wheezing, no crackles  Abd: soft, NT, ND, positive BS Extremities: no edema, no cyanosis   Discharge Instructions   Discharge Instructions    Discharge instructions   Complete by:  As directed    Stop alcohol Stop tobacco abuse Keep yourself well hydrated  Take medications as prescribed  Avoid the use of NSAID's  Follow up with GI as instructed (office will contact you with appointment for repeat Endoscopy) Follow up with PCP in 10 days     Allergies as of 05/12/2017      Reactions   Pineapple Nausea And Vomiting   Tape Itching   *  Paper tape to be used only- please*      Medication List    TAKE these medications   folic acid 1 MG tablet Commonly known as:  FOLVITE Take 1 tablet (1 mg total) by mouth daily. Start taking on:  05/13/2017   multivitamin with minerals Tabs tablet Take 1 tablet by mouth daily. Start taking  on:  05/13/2017   nicotine 21 mg/24hr patch Commonly known as:  NICODERM CQ - dosed in mg/24 hours Place 1 patch (21 mg total) onto the skin daily as needed (for nicotine wthdrawal symptoms).   pantoprazole 40 MG tablet Commonly known as:  PROTONIX Take 1 tablet (40 mg total) by mouth 2 (two) times daily.   sucralfate 1 GM/10ML suspension Commonly known as:  CARAFATE Take 10 mLs (1 g total) by mouth 4 (four) times daily -  with meals and at bedtime.   thiamine 100 MG tablet Take 1 tablet (100 mg total) by mouth daily. Start taking on:  05/13/2017      Allergies  Allergen Reactions  . Pineapple Nausea And Vomiting  . Tape Itching    *Paper tape to be used only- please*   Follow-up Information    Timmothy Euler, MD Follow up in 10 day(s).   Specialty:  Family Medicine Contact information: [address] [address] [phone]           The results of significant diagnostics from this hospitalization (including imaging, microbiology, ancillary and laboratory) are listed below for reference.     Labs: Basic Metabolic Panel: Recent Labs  Lab 05/10/17 2238 05/11/17 0511  NA 135 133*  K 4.5 3.5  CL 107 104  CO2 23 21*  GLUCOSE 107* 94  BUN 15 12  CREATININE 0.93 0.79  CALCIUM 8.3* 8.3*  MG 2.0  --   PHOS 2.3*  --    Liver Function Tests: Recent Labs  Lab 05/10/17 2238 05/11/17 0511  AST 15 17  ALT 5* 8*  ALKPHOS 47 49  BILITOT 0.8 0.7  PROT 5.1* 5.2*  ALBUMIN 2.8* 2.9*   CBC: Recent Labs  Lab 05/10/17 2238 05/11/17 0511 05/11/17 1631 05/12/17 0510  WBC 6.0 5.3  --   --   NEUTROABS 3.7  --   --   --   HGB 8.4* 8.9* 9.0* 8.7*  HCT 25.9* 26.8* 27.2* 26.7*  MCV 90.2 90.2  --   --   PLT 163 177  --   --     Signed:  Barton Dubois MD.  [facility] 05/12/2017, 1:13 PM

## 2017-05-12 NOTE — Progress Notes (Signed)
Reviewed discharge instructions and RX with pt and wife verb understanding. Pt leaving via wheelchair in NAD.

## 2017-05-12 NOTE — Plan of Care (Signed)
Reviewed plan of care with pt. Pt diet advanced to heart healthy.

## 2017-05-13 ENCOUNTER — Encounter (HOSPITAL_COMMUNITY): Payer: Self-pay | Admitting: Internal Medicine

## 2017-05-21 ENCOUNTER — Encounter: Payer: Self-pay | Admitting: Family Medicine

## 2017-05-21 ENCOUNTER — Ambulatory Visit (INDEPENDENT_AMBULATORY_CARE_PROVIDER_SITE_OTHER): Payer: Medicare Other | Admitting: Family Medicine

## 2017-05-21 VITALS — BP 159/68 | HR 73 | Temp 96.7°F | Ht 69.0 in | Wt 163.6 lb

## 2017-05-21 DIAGNOSIS — J449 Chronic obstructive pulmonary disease, unspecified: Secondary | ICD-10-CM | POA: Diagnosis not present

## 2017-05-21 DIAGNOSIS — K922 Gastrointestinal hemorrhage, unspecified: Secondary | ICD-10-CM

## 2017-05-21 DIAGNOSIS — R030 Elevated blood-pressure reading, without diagnosis of hypertension: Secondary | ICD-10-CM | POA: Diagnosis not present

## 2017-05-21 DIAGNOSIS — Z720 Tobacco use: Secondary | ICD-10-CM | POA: Diagnosis not present

## 2017-05-21 NOTE — Progress Notes (Signed)
   HPI  Patient presents today here for hospital F/u  Patient states that he had melena and began having cold chills with strep into the emergency room.  He is admitted to [facility] and worked up with EGD showing gastric AVM and prepyloric erosion.  Seed 2 units of PRBC at [facility] and is cutting back on cigarettes.  PMH: Smoking status noted ROS: Per HPI  Objective: BP (!) 159/68   Pulse 73   Temp (!) 96.7 F (35.9 C) (Oral)   Ht '5\' 9"'$  (1.753 m)   Wt 163 lb 9.6 oz (74.2 kg)   BMI 24.16 kg/m  Gen: NAD, alert, cooperative with exam HEENT: NCAT CV: RRR, good S1/S2, no murmur Resp: CTABL, no wheezes, non-labored Ext: No edema, warm Neuro: Alert and oriented, No gross deficits  Assessment and plan:  #Upper GI bleed Resolved after therapeutic EGD while inpatient, patient has scheduled follow-up with GI. CBC today, status post 2 units PRBC in the emergency room  #Tobacco abuse Recommend cessation, he has cut back  #Elevated blood pressure Blood pressure log given, recommended 2-3 readings per week  COPD At baseline Has cut back on cigarettes No need for rescue inhaler currently  Orders Placed This Encounter  Procedures  . BMP8+EGFR  . CBC with [place], MD [facility] 05/21/2017, 3:26 PM

## 2017-05-21 NOTE — Patient Instructions (Signed)
Great to see you!  Try to stop smoking  Please come back with any concerns, otherwise lets folllow up in 3-4 months

## 2017-05-22 LAB — CBC WITH DIFFERENTIAL/PLATELET
BASOS ABS: 0 10*3/uL (ref 0.0–0.2)
BASOS: 1 %
EOS (ABSOLUTE): 0.1 10*3/uL (ref 0.0–0.4)
Eos: 1 %
HEMATOCRIT: 34 % — AB (ref 37.5–51.0)
HEMOGLOBIN: 10.9 g/dL — AB (ref 13.0–17.7)
Immature Grans (Abs): 0.1 10*3/uL (ref 0.0–0.1)
Immature Granulocytes: 2 %
LYMPHS ABS: 1.5 10*3/uL (ref 0.7–3.1)
Lymphs: 32 %
MCH: 29.9 pg (ref 26.6–33.0)
MCHC: 32.1 g/dL (ref 31.5–35.7)
MCV: 93 fL (ref 79–97)
Monocytes Absolute: 0.8 10*3/uL (ref 0.1–0.9)
Monocytes: 16 %
NEUTROS ABS: 2.3 10*3/uL (ref 1.4–7.0)
Neutrophils: 48 %
Platelets: 287 10*3/uL (ref 150–379)
RBC: 3.64 x10E6/uL — ABNORMAL LOW (ref 4.14–5.80)
RDW: 18.1 % — ABNORMAL HIGH (ref 12.3–15.4)
WBC: 4.8 10*3/uL (ref 3.4–10.8)

## 2017-05-22 LAB — BMP8+EGFR
BUN / CREAT RATIO: 8 — AB (ref 10–24)
BUN: 8 mg/dL (ref 8–27)
CHLORIDE: 99 mmol/L (ref 96–106)
CO2: 21 mmol/L (ref 20–29)
Calcium: 8.8 mg/dL (ref 8.6–10.2)
Creatinine, Ser: 0.96 mg/dL (ref 0.76–1.27)
GFR, EST AFRICAN AMERICAN: 90 mL/min/{1.73_m2} (ref >59)
GFR, EST NON AFRICAN AMERICAN: 78 mL/min/{1.73_m2} (ref >59)
Glucose: 91 mg/dL (ref 65–99)
Potassium: 4.4 mmol/L (ref 3.5–5.2)
Sodium: 134 mmol/L (ref 134–144)

## 2017-06-03 ENCOUNTER — Other Ambulatory Visit (INDEPENDENT_AMBULATORY_CARE_PROVIDER_SITE_OTHER): Payer: Self-pay | Admitting: *Deleted

## 2017-06-03 DIAGNOSIS — K922 Gastrointestinal hemorrhage, unspecified: Secondary | ICD-10-CM

## 2017-06-14 ENCOUNTER — Other Ambulatory Visit (INDEPENDENT_AMBULATORY_CARE_PROVIDER_SITE_OTHER): Payer: Self-pay | Admitting: *Deleted

## 2017-06-14 ENCOUNTER — Encounter (INDEPENDENT_AMBULATORY_CARE_PROVIDER_SITE_OTHER): Payer: Self-pay | Admitting: *Deleted

## 2017-06-14 DIAGNOSIS — K922 Gastrointestinal hemorrhage, unspecified: Secondary | ICD-10-CM

## 2017-06-16 ENCOUNTER — Telehealth (INDEPENDENT_AMBULATORY_CARE_PROVIDER_SITE_OTHER): Payer: Self-pay | Admitting: *Deleted

## 2017-06-16 ENCOUNTER — Encounter (INDEPENDENT_AMBULATORY_CARE_PROVIDER_SITE_OTHER): Payer: Self-pay | Admitting: *Deleted

## 2017-06-16 NOTE — Telephone Encounter (Signed)
Patient needs trilyte 

## 2017-06-17 ENCOUNTER — Other Ambulatory Visit (INDEPENDENT_AMBULATORY_CARE_PROVIDER_SITE_OTHER): Payer: Self-pay | Admitting: *Deleted

## 2017-06-17 DIAGNOSIS — K922 Gastrointestinal hemorrhage, unspecified: Secondary | ICD-10-CM

## 2017-06-17 DIAGNOSIS — Z1211 Encounter for screening for malignant neoplasm of colon: Secondary | ICD-10-CM

## 2017-06-17 LAB — HEMOGLOBIN AND HEMATOCRIT, BLOOD
HEMATOCRIT: 33.5 % — AB (ref 38.5–50.0)
HEMOGLOBIN: 11.1 g/dL — AB (ref 13.2–17.1)

## 2017-06-17 MED ORDER — PEG 3350-KCL-NA BICARB-NACL 420 G PO SOLR: 4000.0000 mL | Freq: Once | ORAL | 0 refills | Status: AC

## 2017-06-21 ENCOUNTER — Telehealth (INDEPENDENT_AMBULATORY_CARE_PROVIDER_SITE_OTHER): Payer: Self-pay | Admitting: Internal Medicine

## 2017-06-21 NOTE — Telephone Encounter (Signed)
Wife called in reference to lab work - please call at ph# [phone]

## 2017-06-21 NOTE — Telephone Encounter (Signed)
I talked with the patient's wife.

## 2017-07-05 ENCOUNTER — Other Ambulatory Visit (INDEPENDENT_AMBULATORY_CARE_PROVIDER_SITE_OTHER): Payer: Self-pay | Admitting: *Deleted

## 2017-07-05 DIAGNOSIS — K922 Gastrointestinal hemorrhage, unspecified: Secondary | ICD-10-CM

## 2017-07-15 ENCOUNTER — Encounter (INDEPENDENT_AMBULATORY_CARE_PROVIDER_SITE_OTHER): Payer: Self-pay | Admitting: *Deleted

## 2017-07-15 ENCOUNTER — Other Ambulatory Visit (INDEPENDENT_AMBULATORY_CARE_PROVIDER_SITE_OTHER): Payer: Self-pay | Admitting: *Deleted

## 2017-07-15 DIAGNOSIS — K922 Gastrointestinal hemorrhage, unspecified: Secondary | ICD-10-CM

## 2017-07-29 ENCOUNTER — Encounter (HOSPITAL_COMMUNITY): Payer: Self-pay | Admitting: *Deleted

## 2017-07-29 ENCOUNTER — Ambulatory Visit (HOSPITAL_COMMUNITY)
Admission: RE | Admit: 2017-07-29 | Discharge: 2017-07-29 | Disposition: A | Discharge status: Home / Self Care | Payer: Medicare Other | Source: Ambulatory Visit | Attending: Internal Medicine | Admitting: Internal Medicine

## 2017-07-29 ENCOUNTER — Other Ambulatory Visit: Payer: Self-pay

## 2017-07-29 ENCOUNTER — Encounter (HOSPITAL_COMMUNITY)
Admission: RE | Disposition: A | Discharge status: Home / Self Care | Payer: Self-pay | Source: Ambulatory Visit | Attending: Internal Medicine

## 2017-07-29 DIAGNOSIS — Z8719 Personal history of other diseases of the digestive system: Secondary | ICD-10-CM | POA: Insufficient documentation

## 2017-07-29 DIAGNOSIS — K210 Gastro-esophageal reflux disease with esophagitis: Secondary | ICD-10-CM | POA: Insufficient documentation

## 2017-07-29 DIAGNOSIS — K3189 Other diseases of stomach and duodenum: Secondary | ICD-10-CM | POA: Diagnosis not present

## 2017-07-29 DIAGNOSIS — K3182 Dieulafoy lesion (hemorrhagic) of stomach and duodenum: Secondary | ICD-10-CM | POA: Diagnosis not present

## 2017-07-29 DIAGNOSIS — K648 Other hemorrhoids: Secondary | ICD-10-CM | POA: Insufficient documentation

## 2017-07-29 DIAGNOSIS — K449 Diaphragmatic hernia without obstruction or gangrene: Secondary | ICD-10-CM | POA: Insufficient documentation

## 2017-07-29 DIAGNOSIS — F17210 Nicotine dependence, cigarettes, uncomplicated: Secondary | ICD-10-CM | POA: Insufficient documentation

## 2017-07-29 DIAGNOSIS — K644 Residual hemorrhoidal skin tags: Secondary | ICD-10-CM | POA: Diagnosis not present

## 2017-07-29 DIAGNOSIS — J449 Chronic obstructive pulmonary disease, unspecified: Secondary | ICD-10-CM | POA: Diagnosis not present

## 2017-07-29 DIAGNOSIS — Z09 Encounter for follow-up examination after completed treatment for conditions other than malignant neoplasm: Secondary | ICD-10-CM | POA: Diagnosis not present

## 2017-07-29 DIAGNOSIS — Z1211 Encounter for screening for malignant neoplasm of colon: Secondary | ICD-10-CM | POA: Diagnosis not present

## 2017-07-29 DIAGNOSIS — K6289 Other specified diseases of anus and rectum: Secondary | ICD-10-CM | POA: Diagnosis not present

## 2017-07-29 DIAGNOSIS — K922 Gastrointestinal hemorrhage, unspecified: Secondary | ICD-10-CM | POA: Insufficient documentation

## 2017-07-29 HISTORY — PX: ESOPHAGOGASTRODUODENOSCOPY: SHX5428

## 2017-07-29 HISTORY — DX: Gastrointestinal hemorrhage, unspecified: K92.2

## 2017-07-29 HISTORY — PX: COLONOSCOPY: SHX5424

## 2017-07-29 LAB — HEMOGLOBIN AND HEMATOCRIT, BLOOD
HCT: 30.6 % — ABNORMAL LOW (ref 39.0–52.0)
Hemoglobin: 9.9 g/dL — ABNORMAL LOW (ref 13.0–17.0)

## 2017-07-29 SURGERY — EGD (ESOPHAGOGASTRODUODENOSCOPY)
Anesthesia: Moderate Sedation

## 2017-07-29 MED ORDER — FLINTSTONES PLUS IRON PO CHEW: 1.0000 | CHEWABLE_TABLET | Freq: Two times a day (BID) | ORAL | Status: AC

## 2017-07-29 MED ORDER — LIDOCAINE VISCOUS 2 % MT SOLN
OROMUCOSAL | Status: DC | PRN
  Administered 2017-07-29: 4 mL via OROMUCOSAL

## 2017-07-29 MED ORDER — SODIUM CHLORIDE 0.9 % IV SOLN
INTRAVENOUS | Status: DC
  Administered 2017-07-29: 09:00:00 via INTRAVENOUS

## 2017-07-29 MED ORDER — MIDAZOLAM HCL 5 MG/5ML IJ SOLN
INTRAMUSCULAR | Status: DC
Start: 2017-07-29 — End: 2017-07-29
  Filled 2017-07-29: qty 10

## 2017-07-29 MED ORDER — MEPERIDINE HCL 50 MG/ML IJ SOLN
INTRAMUSCULAR | Status: DC
Start: 2017-07-29 — End: 2017-07-29
  Filled 2017-07-29: qty 1

## 2017-07-29 MED ORDER — LIDOCAINE VISCOUS 2 % MT SOLN
OROMUCOSAL | Status: AC
  Filled 2017-07-29: qty 15

## 2017-07-29 MED ORDER — PANTOPRAZOLE SODIUM 40 MG PO TBEC: 40.0000 mg | DELAYED_RELEASE_TABLET | Freq: Every day | ORAL | 5 refills | Status: DC

## 2017-07-29 MED ORDER — MEPERIDINE HCL 50 MG/ML IJ SOLN
INTRAMUSCULAR | Status: DC | PRN
  Administered 2017-07-29 (×2): 25 mg via INTRAVENOUS

## 2017-07-29 MED ORDER — MIDAZOLAM HCL 5 MG/5ML IJ SOLN
INTRAMUSCULAR | Status: DC | PRN
  Administered 2017-07-29: 2 mg via INTRAVENOUS
  Administered 2017-07-29: 1 mg via INTRAVENOUS
  Administered 2017-07-29 (×2): 2 mg via INTRAVENOUS

## 2017-07-29 MED ORDER — STERILE WATER FOR IRRIGATION IR SOLN
Status: DC | PRN
  Administered 2017-07-29: 100 mL

## 2017-07-29 NOTE — Op Note (Signed)
[facility] Patient Name: Thomas Davenport Procedure Date: 07/29/2017 9:54 AM MRN: [id number] Date of Birth: 15-Sep-1945 Attending MD: Hildred Laser , MD CSN: [id number] Age: 72 Admit Type: Outpatient Procedure:                Upper GI endoscopy Indications:              Diagnostic procedure, Recent gastrointestinal                            bleeding Providers:                Hildred Laser, MD, Rosina Lowenstein, RN, Aram Candela Referring MD:             Octavio Graves, DO Medicines:                Lidocaine spray, Meperidine 50 mg IV, Midazolam 6                            mg IV Complications:            No immediate complications. Estimated Blood Loss:     Estimated blood loss: none. Procedure:                Pre-Anesthesia Assessment:                           - Prior to the procedure, a History and Physical                            was performed, and patient medications and                            allergies were reviewed. The patient's tolerance of                            previous anesthesia was also reviewed. The risks                            and benefits of the procedure and the sedation                            options and risks were discussed with the patient.                            All questions were answered, and informed consent                            was obtained. Prior Anticoagulants: The patient has                            taken no previous anticoagulant or antiplatelet                            agents. ASA Grade Assessment: II - A patient with  mild systemic disease. After reviewing the risks                            and benefits, the patient was deemed in                            satisfactory condition to undergo the procedure.                           - Prior to the procedure, a History and Physical                            was performed, and patient medications and                            allergies were  reviewed. The patient's tolerance of                            previous anesthesia was also reviewed. The risks                            and benefits of the procedure and the sedation                            options and risks were discussed with the patient.                            All questions were answered, and informed consent                            was obtained. Prior Anticoagulants: The patient has                            taken no previous anticoagulant or antiplatelet                            agents. ASA Grade Assessment: II - A patient with                            mild systemic disease. After reviewing the risks                            and benefits, the patient was deemed in                            satisfactory condition to undergo the procedure.                           After obtaining informed consent, the endoscope was                            passed under direct vision. Throughout the  procedure, the patient's blood pressure, pulse, and                            oxygen saturations were monitored continuously. The                            EG29-[id number] ([id number]) scope was introduced through the                            mouth, and advanced to the second part of duodenum.                            The upper GI endoscopy was accomplished without                            difficulty. The patient tolerated the procedure                            well. The upper GI endoscopy was accomplished                            without difficulty. The patient tolerated the                            procedure well. Scope In: 10:23:56 AM Scope Out: 10:32:46 AM Total Procedure Duration: 0 hours 8 minutes 50 seconds  Findings:      LA Grade C (one or more mucosal breaks continuous between tops of 2 or       more mucosal folds, less than 75% circumference) esophagitis with no       bleeding was found 35 to 40 cm from the incisors.       The Z-line was regular and was found 40 cm from the incisors.      A 3 cm hiatal hernia was present.      Red blood was found in the gastric body and in the gastric antrum.      A Dieulafoy lesion with spurting bleeding and stigmata of recent       bleeding was found in the gastric antrum. To stop active bleeding, two       hemostatic clips were successfully placed (MR conditional). There was no       bleeding at the end of the procedure.      A Scar at previous AVM site was found in the gastric body.      The exam of the stomach was otherwise normal.      The duodenal bulb and second portion of the duodenum were normal. Impression:               - LA Grade C reflux esophagitis.                           - Z-line regular, 40 cm from the incisors.                           - 3 cm hiatal hernia.                           -  Red blood in the gastric antrum and in the                            gastric body.                           - Dieulafoy lesion of stomach.                           - Scar in the gastric body. Treated AVM site.                           - Normal duodenal bulb and second portion of the                            duodenum.                           - No specimens collected. Moderate Sedation:      Moderate (conscious) sedation was administered by the endoscopy nurse       and supervised by the endoscopist. The following parameters were       monitored: oxygen saturation, heart rate, blood pressure, CO2       capnography and response to care. Total physician intraservice time was       15 minutes. Recommendation:           - Patient has a contact number available for                            emergencies. The signs and symptoms of potential                            delayed complications were discussed with the                            patient. Return to normal activities tomorrow.                            Written discharge instructions were provided to the                             patient.                           - Resume previous diet today.                           - Continue present medications.                           - No aspirin, ibuprofen, naproxen, or other                            non-steroidal anti-inflammatory drugs for 1 day.                           -  Use Protonix (pantoprazole) 40 mg PO daily today.                           - [place]stone chewable with iron bid.                           - H/H in one month. Procedure Code(s):        --- Professional ---                           [id number], Esophagogastroduodenoscopy, flexible,                            transoral; with control of bleeding, any method                           G0500, Moderate sedation services provided by the                            same physician or other qualified health care                            professional performing a gastrointestinal                            endoscopic service that sedation supports,                            requiring the presence of an independent trained                            observer to assist in the monitoring of the                            patient's level of consciousness and physiological                            status; initial 15 minutes of intra-service time;                            patient age 65 years or older (additional time may                            be reported with [phone], as appropriate) Diagnosis Code(s):        --- Professional ---                           K21.0, Gastro-esophageal reflux disease with                            esophagitis                           K44.9, Diaphragmatic hernia without obstruction or  gangrene                           K92.2, Gastrointestinal hemorrhage, unspecified                           K31.82, Dieulafoy lesion (hemorrhagic) of stomach                            and duodenum                           K31.89, Other diseases of  stomach and duodenum CPT copyright 2017 American Medical Association. All rights reserved. The codes documented in this report are preliminary and upon coder review may  be revised to meet current compliance requirements. Hildred Laser, MD Hildred Laser, MD 07/29/2017 11:16:49 AM This report has been signed electronically. Number of Addenda: 0

## 2017-07-29 NOTE — H&P (Signed)
Thomas Davenport is an 72 y.o. male.   Chief Complaint: Patient is here for EGD and colonoscopy. HPI: Patient is 72 year old Caucasian male who presented with GI bleed and hemoglobin of 6.8 g in February this year.  EGD revealed large gastric AV malformation.  Coagulation therapy did not work.  Was treated with chemo spray.  He received transfusion and hemoglobin was 8.9 g at the time of discharge.  H&H on 06/17/2017 was 11.1 and 33.5. He has not had any more episodes of melena.  He denies abdominal pain.  He has never been screened for CRC. He is undergoing EGD to make sure gastric AV malformation has been completely obliterated otherwise it would be retreated.  He is also undergoing screening colonoscopy. Family history is negative for CRC.   Past Medical History:  Diagnosis Date  . COPD (chronic obstructive pulmonary disease) ([place])   . Gastrointestinal bleeding   . Glaucoma    left eyes surgical correction 2013    Past Surgical History:  Procedure Laterality Date  . CATARACT EXTRACTION W/PHACO  08/31/2011   Procedure: CATARACT EXTRACTION PHACO AND INTRAOCULAR LENS PLACEMENT (IOC);  Surgeon: Tonny Branch, MD;  Location: [facility] ORS;  Service: Ophthalmology;  Laterality: Left;  CDE:22.56  . CATARACT EXTRACTION W/PHACO  09/14/2011   Procedure: CATARACT EXTRACTION PHACO AND INTRAOCULAR LENS PLACEMENT (IOC);  Surgeon: Tonny Branch, MD;  Location: [facility] ORS;  Service: Ophthalmology;  Laterality: Right;  CDE:24.60   . ESOPHAGOGASTRODUODENOSCOPY N/A 05/11/2017   Procedure: ESOPHAGOGASTRODUODENOSCOPY (EGD);  Surgeon: Rogene Houston, MD;  Location: [facility] ENDO SUITE;  Service: Endoscopy;  Laterality: N/A;    Family History  Problem Relation Age of Onset  . Tuberculosis Father   . Cancer Brother        lung  . Heart attack Brother 44   Social History:  reports that he has been smoking cigarettes.  He has a 67.50 pack-year smoking history. He has never used smokeless tobacco. He reports that he drinks about  42.0 oz of alcohol per week. He reports that he does not use drugs.  Allergies:  Allergies  Allergen Reactions  . Pineapple Nausea And Vomiting  . Tape Itching and Other (See Comments)    *Paper tape to be used only- please*    Medications Prior to Admission  Medication Sig Dispense Refill  . Cyanocobalamin (VITAMIN B-12 PO) Take 1 tablet by mouth daily.    . folic acid (FOLVITE) 1 MG tablet Take 1 tablet (1 mg total) by mouth daily. (Patient not taking: Reported on 07/23/2017) 30 tablet 1  . Multiple Vitamin (MULTIVITAMIN WITH MINERALS) TABS tablet Take 1 tablet by mouth daily. (Patient not taking: Reported on 07/23/2017) 30 tablet 1  . nicotine (NICODERM CQ - DOSED IN MG/24 HOURS) 21 mg/24hr patch Place 1 patch (21 mg total) onto the skin daily as needed (for nicotine wthdrawal symptoms). (Patient not taking: Reported on 07/23/2017) 28 patch 0  . pantoprazole (PROTONIX) 40 MG tablet Take 1 tablet (40 mg total) by mouth 2 (two) times daily. (Patient not taking: Reported on 07/23/2017) 60 tablet 1  . sucralfate (CARAFATE) 1 GM/10ML suspension Take 10 mLs (1 g total) by mouth 4 (four) times daily -  with meals and at bedtime. (Patient not taking: Reported on 07/23/2017) 420 mL 1  . thiamine 100 MG tablet Take 1 tablet (100 mg total) by mouth daily. (Patient not taking: Reported on 07/23/2017) 30 tablet 1    No results found for this or any previous visit (  from the past 48 hour(s)). No results found.  ROS  Blood pressure (!) 171/67, pulse 85, temperature 97.6 F (36.4 C), temperature source Oral, resp. rate (!) 21, height 6' (1.829 m), weight 160 lb (72.6 kg), SpO2 100 %. Physical Exam  Constitutional: He appears well-developed and well-nourished.  HENT:  Mouth/Throat: Oropharynx is clear and moist.  Eyes: Conjunctivae are normal. No scleral icterus.  Neck: No thyromegaly present.  Cardiovascular: Normal rate, regular rhythm and normal heart sounds.  No murmur heard. Respiratory: Effort normal  and breath sounds normal.  GI: Soft. He exhibits no distension and no mass. There is no tenderness.  Musculoskeletal: He exhibits no edema.  Lymphadenopathy:    He has no cervical adenopathy.  Neurological: He is alert.  Skin: Skin is warm and dry.     Assessment/Plan History of upper GI bleed secondary to gastric AV malformation status post therapy in February 2019. Diagnostic/therapeutic EGD followed by average or screening colonoscopy. We will also check H&H today. Hildred Laser, MD 07/29/2017, 10:11 AM

## 2017-07-29 NOTE — Discharge Instructions (Signed)
No aspirin or NSAIDs for 1 week. Resume usual medications as before. Pantoprazole 40 mg by mouth 30 minutes before breakfast daily. [place] chewable 1 tablet twice daily. Resume usual diet. No driving for 24 hours. Physician will call with biopsy results.   Esophagogastroduodenoscopy, Care After Refer to this sheet in the next few weeks. These instructions provide you with information about caring for yourself after your procedure. Your health care provider may also give you more specific instructions. Your treatment has been planned according to current medical practices, but problems sometimes occur. Call your health care provider if you have any problems or questions after your procedure. What can I expect after the procedure? After the procedure, it is common to have:  A sore throat.  Nausea.  Bloating.  Dizziness.  Fatigue.  Follow these instructions at home:  Do not eat or drink anything until the numbing medicine (local anesthetic) has worn off and your gag reflex has returned. You will know that the local anesthetic has worn off when you can swallow comfortably.  Do not drive for 24 hours if you received a medicine to help you relax (sedative).  If your health care provider took a tissue sample for testing during the procedure, make sure to get your test results. This is your responsibility. Ask your health care provider or the department performing the test when your results will be ready.  Keep all follow-up visits as told by your health care provider. This is important. Contact a health care provider if:  You cannot stop coughing.  You are not urinating.  You are urinating less than usual. Get help right away if:  You have trouble swallowing.  You cannot eat or drink.  You have throat or chest pain that gets worse.  You are dizzy or light-headed.  You faint.  You have nausea or vomiting.  You have chills.  You have a fever.  You have severe  abdominal pain.  You have black, tarry, or bloody stools. This information is not intended to replace advice given to you by your health care provider. Make sure you discuss any questions you have with your health care provider. Document Released: 02/24/2012 Document Revised: 08/15/2015 Document Reviewed: 01/31/2015 Elsevier Interactive Patient Education  2018 [employer].     Colonoscopy, Adult, Care After This sheet gives you information about how to care for yourself after your procedure. Your doctor may also give you more specific instructions. If you have problems or questions, call your doctor. Follow these instructions at home: General instructions   For the first 24 hours after the procedure: ? Do not drive or use machinery. ? Do not sign important documents. ? Do not drink alcohol. ? Do your daily activities more slowly than normal. ? Eat foods that are soft and easy to digest. ? Rest often.  Take over-the-counter or prescription medicines only as told by your doctor.  It is up to you to get the results of your procedure. Ask your doctor, or the department performing the procedure, when your results will be ready. To help cramping and bloating:  Try walking around.  Put heat on your belly (abdomen) as told by your doctor. Use a heat source that your doctor recommends, such as a moist heat pack or a heating pad. ? Put a towel between your skin and the heat source. ? Leave the heat on for 20-30 minutes. ? Remove the heat if your skin turns bright red. This is especially important if you cannot  feel pain, heat, or cold. You can get burned. Eating and drinking  Drink enough fluid to keep your pee (urine) clear or pale yellow.  Return to your normal diet as told by your doctor. Avoid heavy or fried foods that are hard to digest.  Avoid drinking alcohol for as long as told by your doctor. Contact a doctor if:  You have blood in your poop (stool) 2-3 days after the  procedure. Get help right away if:  You have more than a small amount of blood in your poop.  You see large clumps of tissue (blood clots) in your poop.  Your belly is swollen.  You feel sick to your stomach (nauseous).  You throw up (vomit).  You have a fever.  You have belly pain that gets worse, and medicine does not help your pain. This information is not intended to replace advice given to you by your health care provider. Make sure you discuss any questions you have with your health care provider. Document Released: 04/11/2010 Document Revised: 12/02/2015 Document Reviewed: 12/02/2015 Elsevier Interactive Patient Education  2017 [employer]    Hemorrhoids Hemorrhoids are swollen veins in and around the rectum or anus. There are two types of hemorrhoids:  Internal hemorrhoids. These occur in the veins that are just inside the rectum. They may poke through to the outside and become irritated and painful.  External hemorrhoids. These occur in the veins that are outside of the anus and can be felt as a painful swelling or hard lump near the anus.  Most hemorrhoids do not cause serious problems, and they can be managed with home treatments such as diet and lifestyle changes. If home treatments do not help your symptoms, procedures can be done to shrink or remove the hemorrhoids. What are the causes? This condition is caused by increased pressure in the anal area. This pressure may result from various things, including:  Constipation.  Straining to have a bowel movement.  Diarrhea.  Pregnancy.  Obesity.  Sitting for long periods of time.  Heavy lifting or other activity that causes you to strain.  Anal sex.  What are the signs or symptoms? Symptoms of this condition include:  Pain.  Anal itching or irritation.  Rectal bleeding.  Leakage of stool (feces).  Anal swelling.  One or more lumps around the anus.  How is this diagnosed? This condition can  often be diagnosed through a visual exam. Other exams or tests may also be done, such as:  Examination of the rectal area with a gloved hand (digital rectal exam).  Examination of the anal canal using a small tube (anoscope).  A blood test, if you have lost a significant amount of blood.  A test to look inside the colon (sigmoidoscopy or colonoscopy).  How is this treated? This condition can usually be treated at home. However, various procedures may be done if dietary changes, lifestyle changes, and other home treatments do not help your symptoms. These procedures can help make the hemorrhoids smaller or remove them completely. Some of these procedures involve surgery, and others do not. Common procedures include:  Rubber band ligation. Rubber bands are placed at the base of the hemorrhoids to cut off the blood supply to them.  Sclerotherapy. Medicine is injected into the hemorrhoids to shrink them.  Infrared coagulation. A type of light energy is used to get rid of the hemorrhoids.  Hemorrhoidectomy surgery. The hemorrhoids are surgically removed, and the veins that supply them are tied off.  Stapled hemorrhoidopexy surgery. A circular stapling device is used to remove the hemorrhoids and use staples to cut off the blood supply to them.  Follow these instructions at home: Eating and drinking  Eat foods that have a lot of fiber in them, such as whole grains, beans, nuts, fruits, and vegetables. Ask your health care provider about taking products that have added fiber (fiber supplements).  Drink enough fluid to keep your urine clear or pale yellow. Managing pain and swelling  Take warm sitz baths for 20 minutes, 3-4 times a day to ease pain and discomfort.  If directed, apply ice to the affected area. Using ice packs between sitz baths may be helpful. ? Put ice in a plastic bag. ? Place a towel between your skin and the bag. ? Leave the ice on for 20 minutes, 2-3 times a  day. General instructions  Take over-the-counter and prescription medicines only as told by your health care provider.  Use medicated creams or suppositories as told.  Exercise regularly.  Go to the bathroom when you have the urge to have a bowel movement. Do not wait.  Avoid straining to have bowel movements.  Keep the anal area dry and clean. Use wet toilet paper or moist towelettes after a bowel movement.  Do not sit on the toilet for long periods of time. This increases blood pooling and pain. Contact a health care provider if:  You have increasing pain and swelling that are not controlled by treatment or medicine.  You have uncontrolled bleeding.  You have difficulty having a bowel movement, or you are unable to have a bowel movement.  You have pain or inflammation outside the area of the hemorrhoids. This information is not intended to replace advice given to you by your health care provider. Make sure you discuss any questions you have with your health care provider. Document Released: 03/06/2000 Document Revised: 08/07/2015 Document Reviewed: 11/21/2014 Elsevier Interactive Patient Education  [employer].

## 2017-07-29 NOTE — Op Note (Signed)
[facility] Patient Name: Thomas Davenport Procedure Date: 07/29/2017 9:54 AM MRN: [id number] Date of Birth: 09-21-1945 Attending MD: Hildred Laser , MD CSN: [id number] Age: 72 Admit Type: Outpatient Procedure:                Colonoscopy Indications:              Screening for colorectal malignant neoplasm Providers:                Hildred Laser, MD, Rosina Lowenstein, RN, Aram Candela Referring MD:             Octavio Graves, DO Medicines:                Midazolam 1 mg IV Complications:            No immediate complications. Estimated Blood Loss:     Estimated blood loss: none. Procedure:                Pre-Anesthesia Assessment:                           - Prior to the procedure, a History and Physical                            was performed, and patient medications and                            allergies were reviewed. The patient's tolerance of                            previous anesthesia was also reviewed. The risks                            and benefits of the procedure and the sedation                            options and risks were discussed with the patient.                            All questions were answered, and informed consent                            was obtained. Prior Anticoagulants: The patient has                            taken no previous anticoagulant or antiplatelet                            agents. ASA Grade Assessment: II - A patient with                            mild systemic disease. After reviewing the risks                            and benefits, the patient was deemed in  satisfactory condition to undergo the procedure.                           After obtaining informed consent, the colonoscope                            was passed under direct vision. Throughout the                            procedure, the patient's blood pressure, pulse, and                            oxygen saturations were monitored continuously.  The                            [id number] ([id number]) scope was introduced through                            the anus and advanced to the the cecum, identified                            by appendiceal orifice and ileocecal valve. The                            colonoscopy was performed without difficulty. The                            patient tolerated the procedure well. The quality                            of the bowel preparation was excellent. The                            ileocecal valve, appendiceal orifice, and rectum                            were photographed. Scope In: 10:39:38 AM Scope Out: 11:02:25 AM Scope Withdrawal Time: 0 hours 10 minutes 2 seconds  Total Procedure Duration: 0 hours 22 minutes 47 seconds  Findings:      The perianal and digital rectal examinations were normal.      The colon (entire examined portion) appeared normal.      External and internal hemorrhoids were found during retroflexion. The       hemorrhoids were small.      Anal papilla(e) were hypertrophied. Impression:               - The entire examined colon is normal.                           - External and internal hemorrhoids.                           - Anal papilla(e) were hypertrophied.                           -  No specimens collected. Moderate Sedation:      Moderate (conscious) sedation was administered by the endoscopy nurse       and supervised by the endoscopist. The following parameters were       monitored: oxygen saturation, heart rate, blood pressure, CO2       capnography and response to care. Total physician intraservice time was       30 minutes. Recommendation:           - Patient has a contact number available for                            emergencies. The signs and symptoms of potential                            delayed complications were discussed with the                            patient. Return to normal activities tomorrow.                            Written  discharge instructions were provided to the                            patient.                           - Resume previous diet today.                           - Continue present medications.                           - No repeat colonoscopy due to age and the absence                            of advanced adenomas. Procedure Code(s):        --- Professional ---                           [phone], Colonoscopy, flexible; diagnostic, including                            collection of specimen(s) by brushing or washing,                            when performed (separate procedure)                           G0500, Moderate sedation services provided by the                            same physician or other qualified health care                            professional performing a gastrointestinal  endoscopic service that sedation supports,                            requiring the presence of an independent trained                            observer to assist in the monitoring of the                            patient's level of consciousness and physiological                            status; initial 15 minutes of intra-service time;                            patient age 19 years or older (additional time may                            be reported with [phone], as appropriate)                           [phone], Moderate sedation services provided by the                            same physician or other qualified health care                            professional performing the diagnostic or                            therapeutic service that the sedation supports,                            requiring the presence of an independent trained                            observer to assist in the monitoring of the                            patient's level of consciousness and physiological                            status; each additional 15 minutes intraservice                             time (List separately in addition to code for                            primary service) Diagnosis Code(s):        --- Professional ---                           Z12.11, Encounter for screening for malignant  neoplasm of colon                           K64.8, Other hemorrhoids                           K62.89, Other specified diseases of anus and rectum CPT copyright 2017 American Medical Association. All rights reserved. The codes documented in this report are preliminary and upon coder review may  be revised to meet current compliance requirements. Hildred Laser, MD Hildred Laser, MD 07/29/2017 11:20:05 AM This report has been signed electronically. Number of Addenda: 0

## 2017-08-03 ENCOUNTER — Encounter (HOSPITAL_COMMUNITY): Payer: Self-pay | Admitting: Internal Medicine

## 2017-11-16 ENCOUNTER — Encounter: Payer: Medicare Other | Admitting: *Deleted

## 2017-11-16 ENCOUNTER — Ambulatory Visit (INDEPENDENT_AMBULATORY_CARE_PROVIDER_SITE_OTHER): Payer: Medicare Other | Admitting: Family Medicine

## 2017-11-16 ENCOUNTER — Encounter: Payer: Self-pay | Admitting: Family Medicine

## 2017-11-16 ENCOUNTER — Other Ambulatory Visit: Payer: Self-pay

## 2017-11-16 VITALS — BP 142/78 | HR 84 | Temp 97.2°F | Ht 72.0 in | Wt 163.0 lb

## 2017-11-16 DIAGNOSIS — Z0000 Encounter for general adult medical examination without abnormal findings: Secondary | ICD-10-CM | POA: Diagnosis not present

## 2017-11-16 NOTE — Progress Notes (Signed)
Subjective:   Thomas Davenport is a 72 y.o. male who presents for Medicare Annual/Subsequent preventive examination.  Review of Systems Denies constitutional symptoms. Cataract surgery 7 years ago, bilateral eyes Denies GI/GU problems Denies Cardiovascular problems  Denies Respiratory problems Denies Musculoskeletal problems       Objective:    Vitals: BP (!) 142/78   Pulse 84   Temp (!) 97.2 F (36.2 C) (Oral)   Ht 6' (1.829 m)   Wt 163 lb (73.9 kg)   BMI 22.11 kg/m   Body mass index is 22.11 kg/m.  Advanced Directives 07/29/2017 05/11/2017 05/10/2017 03/06/2014 08/31/2011 08/26/2011  Does Patient Have a Medical Advance Directive? Yes Yes Yes No Patient does not have advance directive Patient does not have advance directive;Patient would not like information  Type of Advance Directive Living will Healthcare Power of [place]  Does patient want to make changes to medical advance directive? - - No - Patient declined - - -  Copy of [place] in Chart? - - No - copy requested - - -  Pre-existing out of facility DNR order (yellow form or pink MOST form) - - - - No No    Tobacco Social History   Tobacco Use  Smoking Status Current Every Day Smoker  . Packs/day: 1.50  . Years: 45.00  . Pack years: 67.50  . Types: Cigarettes  Smokeless Tobacco Never Used     Ready to quit: Not Answered Counseling given: Not Answered   Clinical Intake:  Pain : No/denies pain     Nutritional Status: BMI of 19-24  Normal Nutritional Risks: None Diabetes: No  How often do you need to have someone help you when you read instructions, pamphlets, or other written materials from your doctor or pharmacy?: 1 - Never What is the last grade level you completed in school?: High school graduate  Interpreter Needed?: No  Information entered by :: Monia Pouch, FNP-C  Past Medical History:  Diagnosis Date  . COPD (chronic obstructive  pulmonary disease) ([place])   . Gastrointestinal bleeding   . Glaucoma    left eyes surgical correction 2013   Past Surgical History:  Procedure Laterality Date  . CATARACT EXTRACTION W/PHACO  08/31/2011   Procedure: CATARACT EXTRACTION PHACO AND INTRAOCULAR LENS PLACEMENT (IOC);  Surgeon: Tonny Branch, MD;  Location: [facility];  Service: Ophthalmology;  Laterality: Left;  CDE:22.56  . CATARACT EXTRACTION W/PHACO  09/14/2011   Procedure: CATARACT EXTRACTION PHACO AND INTRAOCULAR LENS PLACEMENT (IOC);  Surgeon: Tonny Branch, MD;  Location: [facility];  Service: Ophthalmology;  Laterality: Right;  CDE:24.60   . COLONOSCOPY N/A 07/29/2017   Procedure: COLONOSCOPY;  Surgeon: Rogene Houston, MD;  Location: [facility];  Service: Endoscopy;  Laterality: N/A;  . ESOPHAGOGASTRODUODENOSCOPY N/A 05/11/2017   Procedure: ESOPHAGOGASTRODUODENOSCOPY (EGD);  Surgeon: Rogene Houston, MD;  Location: [facility];  Service: Endoscopy;  Laterality: N/A;  . ESOPHAGOGASTRODUODENOSCOPY N/A 07/29/2017   Procedure: ESOPHAGOGASTRODUODENOSCOPY (EGD);  Surgeon: Rogene Houston, MD;  Location: [facility];  Service: Endoscopy;  Laterality: N/A;  1000   Family History  Problem Relation Age of Onset  . Tuberculosis Father   . Cancer Brother        lung  . Heart attack Brother 27   Social History   Socioeconomic History  . Marital status: Married    Spouse name: Not on file  . Number of children: Not on file  .  Years of education: Not on file  . Highest education level: Not on file  Occupational History  . Not on file  Social Needs  . Financial resource strain: Not on file  . Food insecurity:    Worry: Never true    Inability: Never true  . Transportation needs:    Medical: No    Non-medical: No  Tobacco Use  . Smoking status: Current Every Day Smoker    Packs/day: 1.50    Years: 45.00    Pack years: 67.50    Types: Cigarettes  . Smokeless tobacco: Never Used  Substance and Sexual Activity  . Alcohol use:  Yes    Alcohol/week: 28.0 standard drinks    Types: 28 Cans of beer per week    Comment: about 4 cans of beer a day  . Drug use: No  . Sexual activity: Yes    Birth control/protection: None  Lifestyle  . Physical activity:    Days per week: 7 days    Minutes per session: 20 min  . Stress: Not at all  Relationships  . Social connections:    Talks on phone: Twice a week    Gets together: Three times a week    Attends religious service: Never    Active member of club or organization: No    Attends meetings of clubs or organizations: Never    Relationship status: Married  Other Topics Concern  . Not on file  Social History Narrative   Married with 3 children, all girls, 4 grandchildren, 3 great grandchildren    Outpatient Encounter Medications as of 11/16/2017  Medication Sig  . Cyanocobalamin (VITAMIN B-12 PO) Take 1 tablet by mouth daily.  . pantoprazole (PROTONIX) 40 MG tablet Take 1 tablet (40 mg total) by mouth daily before breakfast.  . Pediatric Multivitamins-Iron (FLINTSTONES PLUS IRON) chewable tablet Chew 1 tablet by mouth 2 (two) times daily.   No facility-administered encounter medications on file as of 11/16/2017.     Activities of Daily Living In your present state of health, do you have any difficulty performing the following activities: 11/16/2017 05/10/2017  Hearing? N N  Vision? N N  Difficulty concentrating or making decisions? N N  Walking or climbing stairs? N Y  Dressing or bathing? N Y  Doing errands, shopping? N Y  Some recent data might be hidden    Patient Care Team: Janora Norlander, DO as PCP - General (Family Medicine) Rogene Houston, MD as Consulting Physician (Gastroenterology)   Assessment:   This is a routine wellness examination for Jah.  Exercise Activities and Dietary recommendations    Goals    . DIET - EAT MORE FRUITS AND VEGETABLES    . DIET - INCREASE WATER INTAKE    . Exercise 3x per week (30 min per time)        Fall Risk Fall Risk  11/16/2017 11/16/2017 05/21/2017 10/13/2016 09/29/2016  Falls in the past year? No No No No No  Comment - - - Emmi Telephone Survey: data to providers prior to load -  Number falls in past yr: - - - - -  Injury with Fall? - - - - -  Risk for fall due to : History of fall(s) - - - -   Is the patient's home free of loose throw rugs in walkways, pet beds, electrical cords, etc?   no      Grab bars in the bathroom? yes      Handrails on  the stairs?   yes      Adequate lighting?   yes  Timed Get Up and Go Performed: passed  Depression Screen PHQ 2/9 Scores 11/16/2017 05/21/2017 09/29/2016 08/26/2016  PHQ - 2 Score 0 0 0 0    Cognitive Function MMSE - Mini Mental State Exam 11/16/2017 07/22/2015  Orientation to time 5 5  Orientation to Place 5 5  Registration 3 3  Attention/ Calculation 5 5  Recall 3 2  Language- name 2 objects 2 2  Language- repeat 1 1  Language- follow 3 step command 3 3  Language- read & follow direction 1 1  Write a sentence 1 1  Copy design 1 1  Total score 30 29     6CIT Screen 11/16/2017  What Year? 0 points  What month? 0 points  What time? 0 points  Count back from 20 0 points  Months in reverse 0 points  Repeat phrase 0 points  Total Score 0     There is no immunization history on file for this patient.  Qualifies for Shingles Vaccine? declined  Screening Tests Health Maintenance  Topic Date Due  . INFLUENZA VACCINE  01/21/2018 (Originally 10/21/2017)  . PNA vac Low Risk Adult (1 of 2 - PCV13) 05/22/2018 (Originally 10/23/2008)  . TETANUS/TDAP  10/18/2020  . COLONOSCOPY  07/30/2027  . Hepatitis C Screening  Discontinued   Cancer Screenings: Lung: Low Dose CT Chest recommended if Age 8-80 years, 30 pack-year currently smoking OR have quit w/in 15years. Patient does qualify. Declined Colorectal: due 07/30/2027  Additional Screenings: Hepatitis C Screening: Declined     Plan:    Limit sodium in diet, limit Gatorade  intake, increase fruits and vegetables, increase water intake. Daily exercise.   I have personally reviewed and noted the following in the patient's chart:   . Medical and social history . Use of alcohol, tobacco or illicit drugs  . Current medications and supplements . Functional ability and status . Nutritional status . Physical activity . Advanced directives . List of other physicians . Hospitalizations, surgeries, and ER visits in previous 12 months . Vitals . Screenings to include cognitive, depression, and falls . Referrals and appointments  In addition, I have reviewed and discussed with patient certain preventive protocols, quality metrics, and best practice recommendations. A written personalized care plan for preventive services as well as general preventive health recommendations were provided to patient.     Monia Pouch, [place]  11/16/2017

## 2017-11-16 NOTE — Patient Instructions (Signed)
DASH Eating Plan DASH stands for "Dietary Approaches to Stop Hypertension." The DASH eating plan is a healthy eating plan that has been shown to reduce high blood pressure (hypertension). It may also reduce your risk for type 2 diabetes, heart disease, and stroke. The DASH eating plan may also help with weight loss. What are tips for following this plan? General guidelines  Avoid eating more than 2,300 mg (milligrams) of salt (sodium) a day. If you have hypertension, you may need to reduce your sodium intake to 1,500 mg a day.  Limit alcohol intake to no more than 1 drink a day for nonpregnant women and 2 drinks a day for men. One drink equals 12 oz of beer, 5 oz of wine, or 1 oz of hard liquor.  Work with your health care provider to maintain a healthy body weight or to lose weight. Ask what an ideal weight is for you.  Get at least 30 minutes of exercise that causes your heart to beat faster (aerobic exercise) most days of the week. Activities may include walking, swimming, or biking.  Work with your health care provider or diet and nutrition specialist (dietitian) to adjust your eating plan to your individual calorie needs. Reading food labels  Check food labels for the amount of sodium per serving. Choose foods with less than 5 percent of the Daily Value of sodium. Generally, foods with less than 300 mg of sodium per serving fit into this eating plan.  To find whole grains, look for the word "whole" as the first word in the ingredient list. Shopping  Buy products labeled as "low-sodium" or "no salt added."  Buy fresh foods. Avoid canned foods and premade or frozen meals. Cooking  Avoid adding salt when cooking. Use salt-free seasonings or herbs instead of table salt or sea salt. Check with your health care provider or pharmacist before using salt substitutes.  Do not fry foods. Cook foods using healthy methods such as baking, boiling, grilling, and broiling instead.  Cook with  heart-healthy oils, such as olive, canola, soybean, or sunflower oil. Meal planning   Eat a balanced diet that includes: ? 5 or more servings of fruits and vegetables each day. At each meal, try to fill half of your plate with fruits and vegetables. ? Up to 6-8 servings of whole grains each day. ? Less than 6 oz of lean meat, poultry, or fish each day. A 3-oz serving of meat is about the same size as a deck of cards. One egg equals 1 oz. ? 2 servings of low-fat dairy each day. ? A serving of nuts, seeds, or beans 5 times each week. ? Heart-healthy fats. Healthy fats called Omega-3 fatty acids are found in foods such as flaxseeds and coldwater fish, like sardines, salmon, and mackerel.  Limit how much you eat of the following: ? Canned or prepackaged foods. ? Food that is high in trans fat, such as fried foods. ? Food that is high in saturated fat, such as fatty meat. ? Sweets, desserts, sugary drinks, and other foods with added sugar. ? Full-fat dairy products.  Do not salt foods before eating.  Try to eat at least 2 vegetarian meals each week.  Eat more home-cooked food and less restaurant, buffet, and fast food.  When eating at a restaurant, ask that your food be prepared with less salt or no salt, if possible. What foods are recommended? The items listed may not be a complete list. Talk with your dietitian about what   dietary choices are best for you. Grains Whole-grain or whole-wheat bread. Whole-grain or whole-wheat pasta. Brown rice. Oatmeal. Quinoa. Bulgur. Whole-grain and low-sodium cereals. Pita bread. Low-fat, low-sodium crackers. Whole-wheat flour tortillas. Vegetables Fresh or frozen vegetables (raw, steamed, roasted, or grilled). Low-sodium or reduced-sodium tomato and vegetable juice. Low-sodium or reduced-sodium tomato sauce and tomato paste. Low-sodium or reduced-sodium canned vegetables. Fruits All fresh, dried, or frozen fruit. Canned fruit in natural juice (without  added sugar). Meat and other protein foods Skinless chicken or turkey. Ground chicken or turkey. Pork with fat trimmed off. Fish and seafood. Egg whites. Dried beans, peas, or lentils. Unsalted nuts, nut butters, and seeds. Unsalted canned beans. Lean cuts of beef with fat trimmed off. Low-sodium, lean deli meat. Dairy Low-fat (1%) or fat-free (skim) milk. Fat-free, low-fat, or reduced-fat cheeses. Nonfat, low-sodium ricotta or cottage cheese. Low-fat or nonfat yogurt. Low-fat, low-sodium cheese. Fats and oils Soft margarine without trans fats. Vegetable oil. Low-fat, reduced-fat, or light mayonnaise and salad dressings (reduced-sodium). Canola, safflower, olive, soybean, and sunflower oils. Avocado. Seasoning and other foods Herbs. Spices. Seasoning mixes without salt. Unsalted popcorn and pretzels. Fat-free sweets. What foods are not recommended? The items listed may not be a complete list. Talk with your dietitian about what dietary choices are best for you. Grains Baked goods made with fat, such as croissants, muffins, or some breads. Dry pasta or rice meal packs. Vegetables Creamed or fried vegetables. Vegetables in a cheese sauce. Regular canned vegetables (not low-sodium or reduced-sodium). Regular canned tomato sauce and paste (not low-sodium or reduced-sodium). Regular tomato and vegetable juice (not low-sodium or reduced-sodium). Pickles. Olives. Fruits Canned fruit in a light or heavy syrup. Fried fruit. Fruit in cream or butter sauce. Meat and other protein foods Fatty cuts of meat. Ribs. Fried meat. Bacon. Sausage. Bologna and other processed lunch meats. Salami. Fatback. Hotdogs. Bratwurst. Salted nuts and seeds. Canned beans with added salt. Canned or smoked fish. Whole eggs or egg yolks. Chicken or turkey with skin. Dairy Whole or 2% milk, cream, and half-and-half. Whole or full-fat cream cheese. Whole-fat or sweetened yogurt. Full-fat cheese. Nondairy creamers. Whipped toppings.  Processed cheese and cheese spreads. Fats and oils Butter. Stick margarine. Lard. Shortening. Ghee. Bacon fat. Tropical oils, such as coconut, palm kernel, or palm oil. Seasoning and other foods Salted popcorn and pretzels. Onion salt, garlic salt, seasoned salt, table salt, and sea salt. Worcestershire sauce. Tartar sauce. Barbecue sauce. Teriyaki sauce. Soy sauce, including reduced-sodium. Steak sauce. Canned and packaged gravies. Fish sauce. Oyster sauce. Cocktail sauce. Horseradish that you find on the shelf. Ketchup. Mustard. Meat flavorings and tenderizers. Bouillon cubes. Hot sauce and Tabasco sauce. Premade or packaged marinades. Premade or packaged taco seasonings. Relishes. Regular salad dressings. Where to find more information:  National Heart, Lung, and Blood Institute: www.nhlbi.nih.gov  American Heart Association: www.heart.org Summary  The DASH eating plan is a healthy eating plan that has been shown to reduce high blood pressure (hypertension). It may also reduce your risk for type 2 diabetes, heart disease, and stroke.  With the DASH eating plan, you should limit salt (sodium) intake to 2,300 mg a day. If you have hypertension, you may need to reduce your sodium intake to 1,500 mg a day.  When on the DASH eating plan, aim to eat more fresh fruits and vegetables, whole grains, lean proteins, low-fat dairy, and heart-healthy fats.  Work with your health care provider or diet and nutrition specialist (dietitian) to adjust your eating plan to your individual   calorie needs. This information is not intended to replace advice given to you by your health care provider. Make sure you discuss any questions you have with your health care provider. Document Released: 02/26/2011 Document Revised: 03/02/2016 Document Reviewed: 03/02/2016 Elsevier Interactive Patient Education  2018 [employer].  

## 2017-12-21 DIAGNOSIS — Z23 Encounter for immunization: Secondary | ICD-10-CM | POA: Diagnosis not present

## 2018-05-16 ENCOUNTER — Encounter (INDEPENDENT_AMBULATORY_CARE_PROVIDER_SITE_OTHER): Payer: Self-pay | Admitting: Internal Medicine

## 2018-05-16 ENCOUNTER — Ambulatory Visit (INDEPENDENT_AMBULATORY_CARE_PROVIDER_SITE_OTHER): Payer: Medicare Other | Admitting: Internal Medicine

## 2018-05-16 ENCOUNTER — Encounter (INDEPENDENT_AMBULATORY_CARE_PROVIDER_SITE_OTHER): Payer: Self-pay | Admitting: *Deleted

## 2018-05-16 VITALS — BP 193/70 | HR 78 | Temp 97.6°F | Resp 18 | Ht 72.0 in | Wt 152.4 lb

## 2018-05-16 DIAGNOSIS — R911 Solitary pulmonary nodule: Secondary | ICD-10-CM | POA: Diagnosis not present

## 2018-05-16 DIAGNOSIS — Z862 Personal history of diseases of the blood and blood-forming organs and certain disorders involving the immune mechanism: Secondary | ICD-10-CM

## 2018-05-16 DIAGNOSIS — R918 Other nonspecific abnormal finding of lung field: Secondary | ICD-10-CM

## 2018-05-16 DIAGNOSIS — R634 Abnormal weight loss: Secondary | ICD-10-CM | POA: Diagnosis not present

## 2018-05-16 LAB — CBC
HCT: 41.8 % (ref 38.5–50.0)
HEMOGLOBIN: 14.4 g/dL (ref 13.2–17.1)
MCH: 36.4 pg — ABNORMAL HIGH (ref 27.0–33.0)
MCHC: 34.4 g/dL (ref 32.0–36.0)
MCV: 105.6 fL — ABNORMAL HIGH (ref 80.0–100.0)
MPV: 10.7 fL (ref 7.5–12.5)
PLATELETS: 186 10*3/uL (ref 140–400)
RBC: 3.96 10*6/uL — ABNORMAL LOW (ref 4.20–5.80)
RDW: 13.7 % (ref 11.0–15.0)
WBC: 5.7 10*3/uL (ref 3.8–10.8)

## 2018-05-16 LAB — COMPREHENSIVE METABOLIC PANEL
AG RATIO: 1.7 (calc) (ref 1.0–2.5)
ALBUMIN MSPROF: 4 g/dL (ref 3.6–5.1)
ALT: 5 U/L — ABNORMAL LOW (ref 9–46)
AST: 12 U/L (ref 10–35)
Alkaline phosphatase (APISO): 72 U/L (ref 35–144)
BUN: 7 mg/dL (ref 7–25)
CHLORIDE: 101 mmol/L (ref 98–110)
CO2: 27 mmol/L (ref 20–32)
CREATININE: 0.84 mg/dL (ref 0.70–1.18)
Calcium: 10.1 mg/dL (ref 8.6–10.3)
GLUCOSE: 91 mg/dL (ref 65–139)
Globulin: 2.3 g/dL (calc) (ref 1.9–3.7)
POTASSIUM: 5 mmol/L (ref 3.5–5.3)
SODIUM: 136 mmol/L (ref 135–146)
TOTAL PROTEIN: 6.3 g/dL (ref 6.1–8.1)
Total Bilirubin: 0.6 mg/dL (ref 0.2–1.2)

## 2018-05-16 LAB — TSH: TSH: 3.16 mIU/L (ref 0.40–4.50)

## 2018-05-16 MED ORDER — PSYLLIUM 58.6 % PO POWD: 1.0000 | Freq: Every day | ORAL | 12 refills | Status: DC

## 2018-05-16 NOTE — Patient Instructions (Addendum)
Physician will call  with results of chest CT and blood work when completed. Weight check in 1 month.

## 2018-05-16 NOTE — Progress Notes (Signed)
Presenting complaint;  Weight loss.  Database and subjective:  Patient is 73 year old Caucasian male was evaluated 1 year ago when he was transferred from [place] to [facility] from melena.  He had 2 unit bleed.  EGD revealed large gastric AV malformation which was treated with combination of APC and hemospray.  He return for repeat EGD and colonoscopy on 07/29/2017.  This time he was found to have Dieulafoy lesion with bleeding gastric antrum.  It was clipped.  Scar was noted prior therapy for AV malformation at gastric body.  He also had grade C reflux esophagitis.  Patient has been maintained on PPI. His daughter Santiago Glad called to make this appointment because he has been losing weight.  He has lost close to 15 pounds.  He weighed 167 pounds in February 2019.  Wife states he has very good appetite but he has quit eating bread and he also has cut back on alcohol intake.  He is trying to wean himself off gradually.  Yesterday he had 3 drinks of alcohol.  He has not had any today.  He does not feel nervous or tremulous. He feels heartburn is well controlled with therapy and he denies dysphagia.  He has cough usually when he wakes up in the morning.  He feels he has postnasal drip.  Sputum is always clear.  He denies chest pain or dyspnea.  He does stretching exercises every night before he goes to bed.  He does not do any scheduled walking etc. but stays busy. He does not take OTC NSAIDs. His wife states he was supposed to have follow-up chest CT because CT in February 2019 revealed lung nodule but he has not had any follow-up test.  He continues to smoke cigarettes.  He smokes a pack to pack and 1/2/day.  Current Medications: Outpatient Encounter Medications as of 05/16/2018  Medication Sig  . Cyanocobalamin (VITAMIN B-12 PO) Take 1 tablet by mouth daily.  . pantoprazole (PROTONIX) 40 MG tablet Take 1 tablet (40 mg total) by mouth daily before breakfast.  . Pediatric Multivitamins-Iron (FLINTSTONES  PLUS IRON) chewable tablet Chew 1 tablet by mouth 2 (two) times daily.   No facility-administered encounter medications on file as of 05/16/2018.      Objective: Blood pressure (!) 193/70, pulse 78, temperature 97.6 F (36.4 C), temperature source Oral, resp. rate 18, height 6' (1.829 m), weight 152 lb 6.4 oz (69.1 kg). Patient is alert and in no acute distress. He does not have tremors. Conjunctiva is pink. Sclera is nonicteric Oropharyngeal mucosa is normal. He has upper dental plate but edentulous and lower jaw. No neck masses or thyromegaly noted. Cardiac exam with regular rhythm normal S1 and S2. No murmur or gallop noted. Lungs are clear to auscultation. Abdomen is symmetrical.  Small umbilical hernia which is completely reducible.  On palpation abdomen is soft and nontender without hepatosplenomegaly or masses. No LE edema or clubbing noted.  Labs/studies Results: H&H was 9.9 and 30.6 on 07/29/2017.  Chest CT from 05/10/2017 reviewed reveals 10 mm nodule at left lung base.  Assessment:  #1.  Weight loss.  He has lost 15 pounds in the last 1 year.  He does not have anorexia or constitutional symptoms such as night sweats or fever.  Weight loss may well be due to the fact that he is cut back on alcohol ingestion as well as bread.  Nevertheless one has to worry about occult process.  #2.  History of iron deficiency anemia secondary to GI  blood loss from gastric AV malformation and Dieulafoy lesion.  No evidence of overt GI bleed since last endoscopic intervention in May 2019.  #3.  History of 10 mm pulmonary nodule in left lung discovered 1 year ago.  He has been smoking for many years.  Follow-up study indicated as soon as possible.   Plan:  Patient will go to the lab for CBC, comprehensive chemistry panel and TSH. Chest CT with contrast to be scheduled as soon as possible. Weight check in 1 month. Office visit in 2 months.

## 2018-05-19 ENCOUNTER — Ambulatory Visit (HOSPITAL_COMMUNITY)
Admission: RE | Admit: 2018-05-19 | Discharge: 2018-05-19 | Disposition: A | Discharge status: Home / Self Care | Payer: Medicare Other | Source: Ambulatory Visit | Attending: Internal Medicine | Admitting: Internal Medicine

## 2018-05-19 DIAGNOSIS — R918 Other nonspecific abnormal finding of lung field: Secondary | ICD-10-CM | POA: Insufficient documentation

## 2018-05-19 MED ORDER — IOHEXOL 300 MG/ML  SOLN
75.0000 mL | Freq: Once | INTRAMUSCULAR | Status: AC | PRN
  Administered 2018-05-19: 75 mL via INTRAVENOUS

## 2018-05-23 ENCOUNTER — Other Ambulatory Visit (INDEPENDENT_AMBULATORY_CARE_PROVIDER_SITE_OTHER): Payer: Self-pay | Admitting: *Deleted

## 2018-05-23 ENCOUNTER — Ambulatory Visit (HOSPITAL_COMMUNITY)
Admission: RE | Admit: 2018-05-23 | Discharge: 2018-05-23 | Disposition: A | Discharge status: Home / Self Care | Payer: Medicare Other | Source: Ambulatory Visit | Attending: Internal Medicine | Admitting: Internal Medicine

## 2018-05-23 DIAGNOSIS — R911 Solitary pulmonary nodule: Secondary | ICD-10-CM

## 2018-05-23 MED ORDER — FLUDEOXYGLUCOSE F - 18 (FDG) INJECTION
9.2700 | Freq: Once | INTRAVENOUS | Status: AC | PRN
  Administered 2018-05-23: 9.27 via INTRAVENOUS

## 2018-07-26 ENCOUNTER — Telehealth: Payer: Self-pay | Admitting: *Deleted

## 2018-07-26 ENCOUNTER — Other Ambulatory Visit: Payer: Self-pay

## 2018-07-26 ENCOUNTER — Encounter (INDEPENDENT_AMBULATORY_CARE_PROVIDER_SITE_OTHER): Payer: Self-pay | Admitting: Internal Medicine

## 2018-07-26 ENCOUNTER — Ambulatory Visit (INDEPENDENT_AMBULATORY_CARE_PROVIDER_SITE_OTHER): Payer: Medicare Other | Admitting: Internal Medicine

## 2018-07-26 DIAGNOSIS — D490 Neoplasm of unspecified behavior of digestive system: Secondary | ICD-10-CM | POA: Diagnosis not present

## 2018-07-26 DIAGNOSIS — R634 Abnormal weight loss: Secondary | ICD-10-CM

## 2018-07-26 DIAGNOSIS — R911 Solitary pulmonary nodule: Secondary | ICD-10-CM | POA: Diagnosis not present

## 2018-07-26 NOTE — Telephone Encounter (Signed)
Dr. Laural Golden called and wanted to give [place] an update on patient. He had a ct scan and then a pet scan which showed 1.6 cm hypermetabolic left lower lobe pulmonary nodule, highly suspicious for primary bronchogenic carcinoma.  No evidence of local or distant metastatic disease.  Persistent 8 mm ground-glass nodule in posterior right upper lobe. Low-grade adenocarcinoma cannot be excluded. Recommend continued attention on follow-up CT.  Asbestos related pleural disease.  Two sub-cm hypermetabolic nodules in the right parotid gland, suspicious for tiny parotid neoplasms.  He had these scans on 05/23/2018. Patient has declined any treatment and Dr. Laural Golden wanted to let us know what was going on with patient. Dr. Laural Golden said for you to call him if you had any questions.

## 2018-07-26 NOTE — Progress Notes (Signed)
Virtual Visit via Telephone Note  Patient had scheduled visit today.  Visit was canceled because of ongoing COVID-19 pandemic.  Patient requested tele-visit and I agreed. I connected with Stasia Cavalier on 07/26/18 at 11:30 AM EDT by telephone and verified that I am speaking with the correct person using two identifiers.  Location: Patient: at home Provider: at office   I discussed the limitations, risks, security and privacy concerns of performing an evaluation and management service by telephone and the availability of in person appointments. I also discussed with the patient that there may be a patient responsible charge related to this service. The patient expressed understanding and agreed to proceed.   History of Present Illness:  Patient is 73 year old Caucasian male who has a history of GI bleed from gastric AV malformation and Deulafoy for which he was seen last year.  He has not had any more bleeding and has been maintaining his hemoglobin. He was seen in the office on 05/16/2018 for weight loss.  I felt weight loss may have been due to the fact that he had quit drinking alcohol and was not getting all these calories. Review of old records revealed that he had chest CT at [facility] rocking him revealing 10 mm left lower lobe pulmonary nodule.  He did not have any follow-up study.  Therefore chest CT was obtained and this nodule increased in size to 16 mm.  PET scan revealed increased activity consistent with malignant nodule.  The study also showed to areas with increased activity in right parotid gland. I recommended evaluation by an oncologist and ENT specialist with patient declined.  I have talked with his daughter Santiago Glad and his wife is fully aware.  Patient states he is doing well.  He believes his weight is up 260 pounds.  He says all he does is sit at home and eat.  He is doing his best to quit cigarette smoking.  He states he is down to smoking 10 cigarettes/day.  He denies cough chest  pain shortness of breath.  He also denies abdominal pain melena or rectal bleeding. Patient states he is not prepared to see ENT specialist or an oncologist at this point.   Observations/Objective:  Patient reported his weight to be around 160 pounds.  Assessment and Plan:  #1 Weight loss.  Presuming patient's weight record is correct he has gained 8 pounds.  I would ask if his daughter Santiago Glad could check his weight when she visits her parents.  Weight loss does not appear to be due to malignant pulmonary nodule.  #2.  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  It appears to be a localized process.  Patient has declined further evaluation.  #3.  Abnormal PET scan in reference to right parotid gland revealing areas with increased activity.  Once again patient does not want to follow-up with ENT specialist.  #4.  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  His hemoglobin on 05/16/2018 was 14.4 g.  Follow Up Instructions:  Office visit on as-needed basis. Once again patient encouraged to change his mind and agreed to follow-up with an oncologist.  He has localized disease with excellent prognosis and delaying treatment will make things difficult for him.  I discussed the assessment and treatment plan with the patient. The patient was provided an opportunity to ask questions and all were answered. The patient agreed with the plan and demonstrated an understanding of the instructions.  The patient was advised to call back or seek an in-person evaluation if the symptoms worsen or if the condition fails to improve as anticipated.  Please note that I also called Dr. Lajuana Ripple office and talk with her nurse and brought her up-to-date on patient's condition and work-up.  I provided 5 minutes of non-face-to-face time during this encounter.   Hildred Laser, MD

## 2018-07-27 NOTE — Telephone Encounter (Signed)
Thank you. I read the chart.

## 2019-01-05 DIAGNOSIS — Z23 Encounter for immunization: Secondary | ICD-10-CM | POA: Diagnosis not present

## 2019-01-29 ENCOUNTER — Other Ambulatory Visit: Payer: Self-pay

## 2019-01-29 ENCOUNTER — Observation Stay (HOSPITAL_COMMUNITY)
Admission: EM | Admit: 2019-01-29 | Discharge: 2019-01-30 | Disposition: A | Discharge status: Home / Self Care | Payer: Medicare Other | Attending: Internal Medicine | Admitting: Internal Medicine

## 2019-01-29 ENCOUNTER — Encounter (HOSPITAL_COMMUNITY): Payer: Self-pay | Admitting: Emergency Medicine

## 2019-01-29 ENCOUNTER — Emergency Department (HOSPITAL_COMMUNITY): Payer: Medicare Other

## 2019-01-29 DIAGNOSIS — C3490 Malignant neoplasm of unspecified part of unspecified bronchus or lung: Secondary | ICD-10-CM | POA: Insufficient documentation

## 2019-01-29 DIAGNOSIS — Z20828 Contact with and (suspected) exposure to other viral communicable diseases: Secondary | ICD-10-CM | POA: Diagnosis not present

## 2019-01-29 DIAGNOSIS — Z9841 Cataract extraction status, right eye: Secondary | ICD-10-CM | POA: Insufficient documentation

## 2019-01-29 DIAGNOSIS — K219 Gastro-esophageal reflux disease without esophagitis: Secondary | ICD-10-CM | POA: Insufficient documentation

## 2019-01-29 DIAGNOSIS — I959 Hypotension, unspecified: Secondary | ICD-10-CM | POA: Diagnosis not present

## 2019-01-29 DIAGNOSIS — Z961 Presence of intraocular lens: Secondary | ICD-10-CM | POA: Insufficient documentation

## 2019-01-29 DIAGNOSIS — R55 Syncope and collapse: Secondary | ICD-10-CM | POA: Diagnosis not present

## 2019-01-29 DIAGNOSIS — Z03818 Encounter for observation for suspected exposure to other biological agents ruled out: Secondary | ICD-10-CM | POA: Diagnosis not present

## 2019-01-29 DIAGNOSIS — I491 Atrial premature depolarization: Secondary | ICD-10-CM | POA: Diagnosis not present

## 2019-01-29 DIAGNOSIS — C801 Malignant (primary) neoplasm, unspecified: Secondary | ICD-10-CM

## 2019-01-29 DIAGNOSIS — Z79899 Other long term (current) drug therapy: Secondary | ICD-10-CM | POA: Insufficient documentation

## 2019-01-29 DIAGNOSIS — H539 Unspecified visual disturbance: Secondary | ICD-10-CM | POA: Diagnosis not present

## 2019-01-29 DIAGNOSIS — Z9842 Cataract extraction status, left eye: Secondary | ICD-10-CM | POA: Diagnosis not present

## 2019-01-29 DIAGNOSIS — R42 Dizziness and giddiness: Secondary | ICD-10-CM | POA: Diagnosis not present

## 2019-01-29 DIAGNOSIS — Z801 Family history of malignant neoplasm of trachea, bronchus and lung: Secondary | ICD-10-CM | POA: Insufficient documentation

## 2019-01-29 DIAGNOSIS — I10 Essential (primary) hypertension: Secondary | ICD-10-CM | POA: Insufficient documentation

## 2019-01-29 DIAGNOSIS — H4900 Third [oculomotor] nerve palsy, unspecified eye: Secondary | ICD-10-CM | POA: Insufficient documentation

## 2019-01-29 DIAGNOSIS — G459 Transient cerebral ischemic attack, unspecified: Secondary | ICD-10-CM | POA: Diagnosis not present

## 2019-01-29 DIAGNOSIS — J449 Chronic obstructive pulmonary disease, unspecified: Secondary | ICD-10-CM | POA: Insufficient documentation

## 2019-01-29 DIAGNOSIS — Z85118 Personal history of other malignant neoplasm of bronchus and lung: Secondary | ICD-10-CM | POA: Insufficient documentation

## 2019-01-29 DIAGNOSIS — H4901 Third [oculomotor] nerve palsy, right eye: Secondary | ICD-10-CM | POA: Diagnosis not present

## 2019-01-29 DIAGNOSIS — H538 Other visual disturbances: Secondary | ICD-10-CM | POA: Diagnosis not present

## 2019-01-29 DIAGNOSIS — H4902 Third [oculomotor] nerve palsy, left eye: Secondary | ICD-10-CM | POA: Diagnosis not present

## 2019-01-29 DIAGNOSIS — Z8249 Family history of ischemic heart disease and other diseases of the circulatory system: Secondary | ICD-10-CM | POA: Insufficient documentation

## 2019-01-29 DIAGNOSIS — I6523 Occlusion and stenosis of bilateral carotid arteries: Secondary | ICD-10-CM | POA: Diagnosis not present

## 2019-01-29 DIAGNOSIS — F17210 Nicotine dependence, cigarettes, uncomplicated: Secondary | ICD-10-CM | POA: Insufficient documentation

## 2019-01-29 HISTORY — DX: Malignant (primary) neoplasm, unspecified: C80.1

## 2019-01-29 HISTORY — DX: Gastro-esophageal reflux disease without esophagitis: K21.9

## 2019-01-29 LAB — URINALYSIS, ROUTINE W REFLEX MICROSCOPIC
Bilirubin Urine: NEGATIVE
Glucose, UA: NEGATIVE mg/dL
Hgb urine dipstick: NEGATIVE
Ketones, ur: NEGATIVE mg/dL
Leukocytes,Ua: NEGATIVE
Nitrite: NEGATIVE
Protein, ur: NEGATIVE mg/dL
Specific Gravity, Urine: 1.003 — ABNORMAL LOW (ref 1.005–1.030)
pH: 7 (ref 5.0–8.0)

## 2019-01-29 LAB — RAPID URINE DRUG SCREEN, HOSP PERFORMED
Amphetamines: NOT DETECTED
Barbiturates: NOT DETECTED
Benzodiazepines: NOT DETECTED
Cocaine: NOT DETECTED
Opiates: NOT DETECTED
Tetrahydrocannabinol: NOT DETECTED

## 2019-01-29 LAB — CBC
HCT: 35.9 % — ABNORMAL LOW (ref 39.0–52.0)
Hemoglobin: 12.1 g/dL — ABNORMAL LOW (ref 13.0–17.0)
MCH: 37 pg — ABNORMAL HIGH (ref 26.0–34.0)
MCHC: 33.7 g/dL (ref 30.0–36.0)
MCV: 109.8 fL — ABNORMAL HIGH (ref 80.0–100.0)
Platelets: 199 10*3/uL (ref 150–400)
RBC: 3.27 MIL/uL — ABNORMAL LOW (ref 4.22–5.81)
RDW: 14.5 % (ref 11.5–15.5)
WBC: 4.2 10*3/uL (ref 4.0–10.5)
nRBC: 0 % (ref 0.0–0.2)

## 2019-01-29 LAB — DIFFERENTIAL
Abs Immature Granulocytes: 0.09 10*3/uL — ABNORMAL HIGH (ref 0.00–0.07)
Basophils Absolute: 0.1 10*3/uL (ref 0.0–0.1)
Basophils Relative: 2 %
Eosinophils Absolute: 0.1 10*3/uL (ref 0.0–0.5)
Eosinophils Relative: 1 %
Immature Granulocytes: 2 %
Lymphocytes Relative: 16 %
Lymphs Abs: 0.7 10*3/uL (ref 0.7–4.0)
Monocytes Absolute: 0.5 10*3/uL (ref 0.1–1.0)
Monocytes Relative: 13 %
Neutro Abs: 2.8 10*3/uL (ref 1.7–7.7)
Neutrophils Relative %: 66 %

## 2019-01-29 LAB — PROTIME-INR
INR: 1 (ref 0.8–1.2)
Prothrombin Time: 12.6 seconds (ref 11.4–15.2)

## 2019-01-29 LAB — COMPREHENSIVE METABOLIC PANEL
ALT: 8 U/L (ref 0–44)
AST: 15 U/L (ref 15–41)
Albumin: 3.7 g/dL (ref 3.5–5.0)
Alkaline Phosphatase: 71 U/L (ref 38–126)
Anion gap: 8 (ref 5–15)
BUN: 11 mg/dL (ref 8–23)
CO2: 27 mmol/L (ref 22–32)
Calcium: 8.9 mg/dL (ref 8.9–10.3)
Chloride: 100 mmol/L (ref 98–111)
Creatinine, Ser: 1.1 mg/dL (ref 0.61–1.24)
GFR calc Af Amer: >60 mL/min (ref >60)
GFR calc non Af Amer: >60 mL/min (ref >60)
Glucose, Bld: 99 mg/dL (ref 70–99)
Potassium: 3.6 mmol/L (ref 3.5–5.1)
Sodium: 135 mmol/L (ref 135–145)
Total Bilirubin: 0.9 mg/dL (ref 0.3–1.2)
Total Protein: 6.7 g/dL (ref 6.5–8.1)

## 2019-01-29 LAB — ETHANOL: Alcohol, Ethyl (B): <10 mg/dL (ref <10)

## 2019-01-29 LAB — APTT: aPTT: 25 seconds (ref 24–36)

## 2019-01-29 MED ORDER — ASPIRIN 325 MG PO TABS
325.0000 mg | ORAL_TABLET | Freq: Once | ORAL | Status: AC
  Administered 2019-01-29: 325 mg via ORAL
  Filled 2019-01-29: qty 1

## 2019-01-29 MED ORDER — ATORVASTATIN CALCIUM 40 MG PO TABS: 40.0000 mg | ORAL_TABLET | Freq: Every day | ORAL | Status: DC

## 2019-01-29 MED ORDER — LABETALOL HCL 5 MG/ML IV SOLN: 10.0000 mg | INTRAVENOUS | Status: DC | PRN

## 2019-01-29 MED ORDER — PANTOPRAZOLE SODIUM 40 MG PO TBEC
40.0000 mg | DELAYED_RELEASE_TABLET | Freq: Every day | ORAL | Status: DC
  Administered 2019-01-30: 40 mg via ORAL
  Filled 2019-01-29: qty 1

## 2019-01-29 MED ORDER — ACETAMINOPHEN 325 MG PO TABS
650.0000 mg | ORAL_TABLET | Freq: Four times a day (QID) | ORAL | Status: DC | PRN
  Administered 2019-01-29: 650 mg via ORAL
  Filled 2019-01-29: qty 2

## 2019-01-29 MED ORDER — ONDANSETRON HCL 4 MG PO TABS: 4.0000 mg | ORAL_TABLET | Freq: Four times a day (QID) | ORAL | Status: DC | PRN

## 2019-01-29 MED ORDER — ASPIRIN EC 81 MG PO TBEC
81.0000 mg | DELAYED_RELEASE_TABLET | Freq: Every day | ORAL | Status: DC
  Administered 2019-01-30: 81 mg via ORAL
  Filled 2019-01-29: qty 1

## 2019-01-29 MED ORDER — ENOXAPARIN SODIUM 40 MG/0.4ML ~~LOC~~ SOLN
40.0000 mg | SUBCUTANEOUS | Status: DC
  Administered 2019-01-29: 40 mg via SUBCUTANEOUS
  Filled 2019-01-29: qty 0.4

## 2019-01-29 MED ORDER — ACETAMINOPHEN 650 MG RE SUPP: 650.0000 mg | Freq: Four times a day (QID) | RECTAL | Status: DC | PRN

## 2019-01-29 MED ORDER — POLYETHYLENE GLYCOL 3350 17 G PO PACK: 17.0000 g | PACK | Freq: Every day | ORAL | Status: DC | PRN

## 2019-01-29 MED ORDER — NICOTINE 21 MG/24HR TD PT24
21.0000 mg | MEDICATED_PATCH | Freq: Once | TRANSDERMAL | Status: AC
  Administered 2019-01-29: 21 mg via TRANSDERMAL
  Filled 2019-01-29: qty 1

## 2019-01-29 MED ORDER — ONDANSETRON HCL 4 MG/2ML IJ SOLN: 4.0000 mg | Freq: Four times a day (QID) | INTRAMUSCULAR | Status: DC | PRN

## 2019-01-29 NOTE — H&P (Signed)
History and Physical    Thomas Davenport TKZ:[id number] DOB: 03/03/1946 DOA: 01/29/2019  PCP: Baruch Gouty, FNP   Patient coming from: Home  I have personally briefly reviewed patient's old medical records in [facility]  Chief Complaint: Vision change  HPI: Thomas Davenport is a 73 y.o. male with medical history significant for lung cancer, COPD, alcohol use, hypertension, presented to the ED with complaints of sudden change in vision noticed at 8 PM last night.  Patient reports triple vision since last night, he went to bed woke up this morning with same symptoms, and difficulty with balance.  Patient spouse reported that patient was wobbly. No prior history of strokes.  Denies weakness of his extremities.  Denies being on aspirin.  At the time of my evaluation he tells me that his double vision has resolved.  ED Course: Blood pressure elevated up to 573 systolic.  Unremarkable CBC BMP.  Blood alcohol level less than 10.  Head CT negative for acute abnormality.  Telemetry neurology consulted-date for TPI with LK W greater than 4.5 hours.  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  Recommended admission for stroke work-up, to include MRI head with contrast considering history of lung cancer.  Review of Systems: As per HPI all other systems reviewed and negative.  Past Medical History:  Diagnosis Date   Cancer ([place]) 01/29/2019   lung cancer dx february 2020   COPD (chronic obstructive pulmonary disease) (HCC)    Gastrointestinal bleeding    GERD (gastroesophageal reflux disease)    Glaucoma    left eyes surgical correction 2013    Past Surgical History:  Procedure Laterality Date   CATARACT EXTRACTION W/PHACO  08/31/2011   Procedure: CATARACT EXTRACTION PHACO AND INTRAOCULAR LENS PLACEMENT ([place]);  Surgeon: Tonny Branch, MD;  Location: [facility];  Service: Ophthalmology;  Laterality:  Left;  CDE:22.56   CATARACT EXTRACTION W/PHACO  09/14/2011   Procedure: CATARACT EXTRACTION PHACO AND INTRAOCULAR LENS PLACEMENT ([facility]);  Surgeon: Tonny Branch, MD;  Location: [facility];  Service: Ophthalmology;  Laterality: Right;  CDE:24.60    COLONOSCOPY N/A 07/29/2017   Procedure: COLONOSCOPY;  Surgeon: Rogene Houston, MD;  Location: [facility];  Service: Endoscopy;  Laterality: N/A;   ESOPHAGOGASTRODUODENOSCOPY N/A 05/11/2017   Procedure: ESOPHAGOGASTRODUODENOSCOPY (EGD);  Surgeon: Rogene Houston, MD;  Location: [facility];  Service: Endoscopy;  Laterality: N/A;   ESOPHAGOGASTRODUODENOSCOPY N/A 07/29/2017   Procedure: ESOPHAGOGASTRODUODENOSCOPY (EGD);  Surgeon: Rogene Houston, MD;  Location: [facility];  Service: Endoscopy;  Laterality: N/A;  1000     reports that he has been smoking cigarettes. He has a 67.50 pack-year smoking history. He has never used smokeless tobacco. He reports current alcohol use of about 28.0 standard drinks of alcohol per week. He reports that he does not use drugs.  Allergies  Allergen Reactions   Pineapple Nausea And Vomiting   Tape Itching and Other (See Comments)    *Paper tape to be used only- please*    Family History  Problem Relation Age of Onset   Tuberculosis Father    Cancer Brother        lung   Heart attack Brother 64    Prior to Admission medications   Medication Sig Start Date End Date Taking? Authorizing Provider  meclizine (ANTIVERT) 25 MG tablet Take 25 mg by mouth 3 (three) times daily as needed for dizziness.   Yes [provider]  pantoprazole (PROTONIX) 40 MG tablet Take 1 tablet (40 mg total) by mouth daily before breakfast. 07/29/17  Yes Rehman, Mechele Dawley, MD  Pediatric Multivitamins-Iron (FLINTSTONES PLUS IRON) chewable tablet Chew 1 tablet by mouth 2 (two) times daily. 07/29/17  Yes Rehman, Mechele Dawley, MD  Cyanocobalamin (VITAMIN B-12 PO) Take 1 tablet by mouth daily.    [provider]  psyllium  (METAMUCIL SMOOTH TEXTURE) 58.6 % powder Take 1 packet by mouth daily. Patient not taking: Reported on 07/26/2018 05/16/18   Rogene Houston, MD    Physical Exam: Vitals:   01/29/19 1530 01/29/19 1600 01/29/19 1711 01/29/19 1715  BP: (!) 195/83 (!) 196/97 (!) 192/87   Pulse: 76  78 80  Resp: 15 17 20    Temp:   98.4 F (36.9 C)   TempSrc:   Oral   SpO2: 97% 98% 99% 99%  Weight:      Height:        Constitutional: NAD, calm, comfortable Vitals:   01/29/19 1530 01/29/19 1600 01/29/19 1711 01/29/19 1715  BP: (!) 195/83 (!) 196/97 (!) 192/87   Pulse: 76  78 80  Resp: 15 17 20    Temp:   98.4 F (36.9 C)   TempSrc:   Oral   SpO2: 97% 98% 99% 99%  Weight:      Height:       Eyes: Pupils round, lids and conjunctivae normal ENMT: Mucous membranes are moist. Posterior pharynx clear of any exudate or lesions. Neck: normal, supple, no masses, no thyromegaly Respiratory: clear to auscultation bilaterally, no wheezing, no crackles. Normal respiratory effort. No accessory muscle use.  Cardiovascular: Regular rate and rhythm, no murmurs / rubs / gallops. No extremity edema. 2+ pedal pulses.   Abdomen: no tenderness, no masses palpated. No hepatosplenomegaly. Bowel sounds positive.  Musculoskeletal: no clubbing / cyanosis. No joint deformity upper and lower extremities. Good ROM, no contractures. Normal muscle tone.  Skin: no rashes, lesions, ulcers. No induration Neurologic: Vision appears to be normal in both eyes with finger counting, BUT left eye NOT crossing the midline medially.  No other apparent cranial nerve abnormalities, strength 5/5 in all extremities, sensation intact globally.  Finger-to-nose test normal. Psychiatric: Normal judgment and insight. Alert and oriented x 3. Normal mood.   Labs on Admission: I have personally reviewed following labs and imaging studies  CBC: Recent Labs  Lab 01/29/19 1219  WBC 4.2  NEUTROABS 2.8  HGB 12.1*  HCT 35.9*  MCV 109.8*  PLT 786    Basic Metabolic Panel: Recent Labs  Lab 01/29/19 1219  NA 135  K 3.6  CL 100  CO2 27  GLUCOSE 99  BUN 11  CREATININE 1.10  CALCIUM 8.9   Liver Function Tests: Recent Labs  Lab 01/29/19 1219  AST 15  ALT 8  ALKPHOS 71  BILITOT 0.9  PROT 6.7  ALBUMIN 3.7   Coagulation Profile: Recent Labs  Lab 01/29/19 1219  INR 1.0   Urine analysis:    Component Value Date/Time   COLORURINE STRAW (A) 01/29/2019 1254   APPEARANCEUR CLEAR 01/29/2019 1254   LABSPEC 1.003 (L) 01/29/2019 1254   PHURINE 7.0 01/29/2019 1254   GLUCOSEU NEGATIVE 01/29/2019 1254   HGBUR NEGATIVE 01/29/2019 1254   BILIRUBINUR NEGATIVE 01/29/2019 1254   KETONESUR NEGATIVE 01/29/2019 1254   PROTEINUR NEGATIVE 01/29/2019 1254   UROBILINOGEN 0.2 03/06/2014 2224   NITRITE NEGATIVE 01/29/2019 1254   LEUKOCYTESUR NEGATIVE 01/29/2019 1254    Radiological Exams on Admission: Ct Head  Wo Contrast  Result Date: 01/29/2019 CLINICAL DATA:  Patient was watching television about 8 pm last night and had triple vision. He did not tell his wife until this am. Pt was weak and not able to stand or walk. EXAM: CT HEAD WITHOUT CONTRAST TECHNIQUE: Contiguous axial images were obtained from the base of the skull through the vertex without intravenous contrast. COMPARISON:  03/07/2014 FINDINGS: Brain: No evidence of acute infarction, hemorrhage, extra-axial collection, ventriculomegaly, or mass effect. Generalized cerebral atrophy. Periventricular white matter low attenuation likely secondary to microangiopathy. Vascular: Cerebrovascular atherosclerotic calcifications are noted. Skull: Negative for fracture or focal lesion. Sinuses/Orbits: Visualized portions of the orbits are unremarkable. Visualized portions of the paranasal sinuses are unremarkable. Visualized portions of the mastoid air cells are unremarkable. Other: None. IMPRESSION: 1. No acute intracranial pathology. 2. Chronic microvascular disease and cerebral atrophy.  Electronically Signed   By: Kathreen Devoid   On: 01/29/2019 13:29    EKG: Independently reviewed.  Sinus rhythm.  No significant change from prior.  QTc 442.  Assessment/Plan Active Problems:   Change in vision   Vision change- triple vision, on exam left eye fails to cross midline medially.  Reported gait abnormality-etiology ? Visual change versus stroke. Head CT negative for acute abnormality.  Aspirin nave.  Tele Neurology consulted, rule out acute ischemic stroke, internuclear ophthalmoplegia of left eye, differential to include intracranial space-occupying lesion. -MRI brain with and without contrast -MRA Head and neck -Echocardiogram - Pt Evaluation -Lipid panel,Hgba1c in a.m. -Aspirin 325x1 continue 81 mg daily -Start atorvastatin 40 mg daily -Neurochecks q4h -In-house neurology consult -Allow For permissive hypertension  Lung cancer- patient has declined treatment for this.  Per gastroenterology notes, heart CT scan of the chest and subsequent PET scan showing left lower lobe malignant pulmonary nodule, also increased activity in the right parotid gland. -Brain MRI to rule out metastasis to brain.  Hypertension-elevated systolic up to 977.  Not on home antihypertensives. -As needed labetalol  COPD-stable.  On-going tobacco use.   DVT prophylaxis: Lovenox Code Status: Full code Family Communication: Family member at bedside.  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 Disposition Plan: 1 to 2 days Consults called: Neurology consult Admission status: Observation, telemetry   [place] MD [facility]  01/29/2019, 7:11 PM

## 2019-01-29 NOTE — Consult Note (Signed)
[facility] TeleNeurology Consult Services  Stat Consult  Date of Service:   01/29/2019 13:50:34  Impression:     .  Rule Out Acute Ischemic Stroke     .  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 Not a candidated for tPA as LKW>4.5 hrs. FANG D LVO screen -ve Chief differential: Intracranial space occupying lesion  CT HEAD: Reviewed 1. No acute intracranial pathology. 2. Chronic microvascular disease and cerebral atrophy.  Metrics: [facility] Notification Time: 01/29/2019 13:48:15 Stamp Time: 01/29/2019 13:50:34 Callback Response Time: 01/29/2019 13:57:16 Video Start Time: 01/29/2019 14:29:13 Video End Time: 01/29/2019 14:53:31  Our recommendations are outlined below.  Imaging Studies:     .  MRI Head with and Without Contrast     .  MRA Head and Neck Without Contrast When Available - Stroke Protocol     .  Echocardiogram - Transthoracic Echocardiogram  Therapies:     .  Physical Therapy, Occupational Therapy, Speech Therapy Assessment When Applicable  Other WorkUp:     .  Check an ammonia level     .  Check B12 level     .  Check TSH  Disposition: Neurology Follow Up Recommended  Sign Out:     .  Discussed with Emergency Department Provider  ----------------------------------------------------------------------------------------------------  Chief Complaint: Diplopia  History of Present Illness: Patient is a 73 year old Male.  h/o Lung cancer, smoker Developed triple vision last night 8pm. Pt was watching television. Pt was weak and not able to stand or walk. This morning woke with visual disturbance.   Past Medical History:     . Hypertension     .  Hyperlipidemia   Examination: BP(168/60), Pulse( 80 with frequesnt PVCs), Blood Glucose(99) 1A: Level of Consciousness - Alert; keenly responsive + 0 1B: Ask Month and Age - Both Questions Right + 0 1C: Blink Eyes & Squeeze Hands - Performs Both Tasks + 0 2: Test Horizontal Extraocular Movements - Partial Gaze Palsy: Can Be Overcome + 1 3: Test Visual Fields - No Visual Loss + 0 4: Test Facial Palsy (Use Grimace if Obtunded) - Normal symmetry + 0 5A: Test Left Arm Motor Drift - No Drift for 10 Seconds + 0 5B: Test Right Arm Motor Drift - No Drift for 10 Seconds + 0 6A: Test Left Leg Motor Drift - No Drift for 5 Seconds + 0 6B: Test Right Leg Motor Drift - No Drift for 5 Seconds + 0 7: Test Limb Ataxia (FNF/Heel-Shin) - No Ataxia + 0 8: Test Sensation - Normal; No sensory loss + 0 9: Test Language/Aphasia - Normal; No aphasia + 0 10: Test Dysarthria - Normal + 0 11: Test Extinction/Inattention - No abnormality + 0  NIHSS Score: 1   Due to the immediate potential for life-threatening deterioration due to underlying acute neurologic illness, I spent 35 minutes providing critical care. This time includes time for face to face visit via telemedicine, review of medical records, imaging studies and discussion of findings with providers, the patient and/or family.   Dr Suzi Roots Milbert Bixler   [facility] [phone]   Case [id number]

## 2019-01-29 NOTE — ED Triage Notes (Signed)
daughter in, says pt was watching television about 8 pm last night and had triple vision.  He did not tell his wife until this am.  Pt was weak and not able to stand or walk.

## 2019-01-29 NOTE — ED Triage Notes (Signed)
EMS called out for blurred vision and dizziness, started last night.  168/60 HR 80 with frequesnt PVCs.  # 18 g left FA Sats 97%, O2 @3l /m due to PVCs.

## 2019-01-29 NOTE — ED Notes (Signed)
Teleneurology called to [phone] at this time.

## 2019-01-29 NOTE — ED Provider Notes (Signed)
Emergency Department Provider Note   I have reviewed the triage vital signs and the nursing notes.   HISTORY  Chief Complaint Blurred Vision (last night)   HPI Thomas Davenport is a 73 y.o. male with PMH of COPD and Glaucoma presents to the emergency department with acute onset vision change and balance difficulty.  Patient states that last night, at approximately 8 PM, he developed painless vision change.  He notes seeing "tripple" but did not alert family. He has difficulty with walking and noted balance issues this AM. When he told family today they called EMS.  EMS began oxygen due to frequent PVCs in route but noted normal oxygen saturation.  Patient has not had flulike illness, chest pain, heart palpitations.  Denies headache.  Patient denies any unilateral weakness or numbness.  No falls or head injury. No similar symptoms in the past. No speech change or difficulty swallowing.   Past Medical History:  Diagnosis Date  . Cancer ([place]) 01/29/2019   lung cancer dx february 2020  . COPD (chronic obstructive pulmonary disease) ([place])   . Gastrointestinal bleeding   . GERD (gastroesophageal reflux disease)   . Glaucoma    left eyes surgical correction 2013    Patient Active Problem List   Diagnosis Date Noted  . Change in vision 01/29/2019  . Gastrointestinal hemorrhage 06/17/2017  . Special screening for malignant neoplasms, colon 06/17/2017  . Elevated blood pressure reading 05/21/2017  . Acute blood loss anemia   . Hypophosphatemia 05/11/2017  . UGI bleed 05/10/2017  . Faintness   . Syncope 03/07/2014  . COPD (chronic obstructive pulmonary disease) ([place]) 03/07/2014  . Hyponatremia 03/07/2014  . Leukocytosis 03/07/2014  . Tobacco abuse 03/07/2014  . Alcohol abuse 03/07/2014  . Back pain 03/07/2014  . Glaucoma   . Compression fracture of L1 lumbar vertebra (HCC)   . Fall     Past Surgical History:  Procedure Laterality Date  . CATARACT EXTRACTION W/PHACO  08/31/2011    Procedure: CATARACT EXTRACTION PHACO AND INTRAOCULAR LENS PLACEMENT ([facility]);  Surgeon: Tonny Branch, MD;  Location: [facility];  Service: Ophthalmology;  Laterality: Left;  CDE:22.56  . CATARACT EXTRACTION W/PHACO  09/14/2011   Procedure: CATARACT EXTRACTION PHACO AND INTRAOCULAR LENS PLACEMENT ([facility]);  Surgeon: Tonny Branch, MD;  Location: [facility];  Service: Ophthalmology;  Laterality: Right;  CDE:24.60   . COLONOSCOPY N/A 07/29/2017   Procedure: COLONOSCOPY;  Surgeon: Rogene Houston, MD;  Location: [facility];  Service: Endoscopy;  Laterality: N/A;  . ESOPHAGOGASTRODUODENOSCOPY N/A 05/11/2017   Procedure: ESOPHAGOGASTRODUODENOSCOPY (EGD);  Surgeon: Rogene Houston, MD;  [facility];  Service: Endoscopy;  Laterality: N/A;  . ESOPHAGOGASTRODUODENOSCOPY N/A 07/29/2017   Procedure: ESOPHAGOGASTRODUODENOSCOPY (EGD);  Surgeon: Rogene Houston, MD;  [facility];  Service: Endoscopy;  Laterality: N/A;  1000    Allergies Pineapple and Tape  Family History  Problem Relation Age of Onset  . Tuberculosis Father   . Cancer Brother        lung  . Heart attack Brother 74    Social History Social History   Tobacco Use  . Smoking status: Current Every Day Smoker    Packs/day: 1.50    Years: 45.00    Pack years: 67.50    Types: Cigarettes  . Smokeless tobacco: Never Used  Substance Use Topics  . Alcohol use: Yes    Alcohol/week: 28.0 standard drinks    Types: 28 Cans of beer per week  Comment: about 4 cans of beer a day  . Drug use: No    Review of Systems  Constitutional: No fever/chills Eyes: Positive vision change.  ENT: No sore throat. Cardiovascular: Denies chest pain. Respiratory: Denies shortness of breath. Gastrointestinal: No abdominal pain.  No nausea, no vomiting.  No diarrhea.  No constipation. Genitourinary: Negative for dysuria. Musculoskeletal: Negative for back pain. Skin: Negative for rash. Neurological: Negative for headaches, focal weakness or  numbness. Positive balance issues.   10-point ROS otherwise negative.  ____________________________________________   PHYSICAL EXAM:  VITAL SIGNS: ED Triage Vitals  Enc Vitals Group     BP 01/29/19 1109 (!) 207/79     Pulse Rate 01/29/19 1109 82     Resp 01/29/19 1118 18     Temp 01/29/19 1109 98.5 F (36.9 C)     Temp Source 01/29/19 1109 Oral     SpO2 01/29/19 1109 100 %     Weight 01/29/19 1109 170 lb (77.1 kg)     Height 01/29/19 1109 6' (1.829 m)    Constitutional: Alert and oriented. Well appearing and in no acute distress. Eyes: Conjunctivae are normal. 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  Head: Atraumatic. Nose: No congestion/rhinnorhea. Mouth/Throat: Mucous membranes are moist.   Neck: No stridor.   Cardiovascular: Normal rate, regular rhythm. Good peripheral circulation. Grossly normal heart sounds.   Respiratory: Normal respiratory effort.  No retractions. Lungs CTAB. Gastrointestinal: Soft and nontender. No distention.  Musculoskeletal: No lower extremity tenderness nor edema. No gross deformities of extremities. Neurologic:  Normal speech and language. No pronator drift. 5/5 strength in the upper and lower extremities. Skin:  Skin is warm, dry and intact. No rash noted.   ____________________________________________   LABS (all labs ordered are listed, but only abnormal results are displayed)  Labs Reviewed  CBC - Abnormal; Notable for the following components:      Result Value   RBC 3.27 (*)    Hemoglobin 12.1 (*)    HCT 35.9 (*)    MCV 109.8 (*)    MCH 37.0 (*)    All other components within normal limits  DIFFERENTIAL - Abnormal; Notable for the following components:   Abs Immature Granulocytes 0.09 (*)    All other components within normal limits  URINALYSIS, ROUTINE W REFLEX MICROSCOPIC - Abnormal; Notable for the following components:   Color, Urine STRAW (*)    Specific Gravity, Urine 1.003 (*)    All other components within normal limits  SARS  CORONAVIRUS 2 (TAT 6-24 HRS)  ETHANOL  PROTIME-INR  APTT  COMPREHENSIVE METABOLIC PANEL  RAPID URINE DRUG SCREEN, HOSP PERFORMED   ____________________________________________  EKG   EKG Interpretation  Date/Time:  Sunday January 29 2019 12:00:03 EST Ventricular Rate:  68 PR Interval:    QRS Duration: 108 QT Interval:  415 QTC Calculation: 442 R Axis:   85 Text Interpretation: Sinus rhythm Borderline right axis deviation Minimal ST depression, lateral leads No STEMI Confirmed by Nanda Quinton [id number]) on 01/29/2019 6:33:27 PM       ____________________________________________  RADIOLOGY  Ct Head Wo Contrast  Result Date: 01/29/2019 CLINICAL DATA:  Patient was watching television about 8 pm last night and had triple vision. He did not tell his wife until this am. Pt was weak and not able to stand or walk. EXAM: CT HEAD WITHOUT CONTRAST TECHNIQUE: Contiguous axial images were obtained from the base of the skull through the vertex without intravenous contrast. COMPARISON:  03/07/2014 FINDINGS: Brain: No evidence of  acute infarction, hemorrhage, extra-axial collection, ventriculomegaly, or mass effect. Generalized cerebral atrophy. Periventricular white matter low attenuation likely secondary to microangiopathy. Vascular: Cerebrovascular atherosclerotic calcifications are noted. Skull: Negative for fracture or focal lesion. Sinuses/Orbits: Visualized portions of the orbits are unremarkable. Visualized portions of the paranasal sinuses are unremarkable. Visualized portions of the mastoid air cells are unremarkable. Other: None. IMPRESSION: 1. No acute intracranial pathology. 2. Chronic microvascular disease and cerebral atrophy. Electronically Signed   By: Kathreen Devoid   On: 01/29/2019 13:29    ____________________________________________   PROCEDURES  Procedure(s) performed:   Procedures  CRITICAL CARE Performed by: Margette Fast Total critical care time: 35 minutes Critical  care time was exclusive of separately billable procedures and treating other patients. Critical care was necessary to treat or prevent imminent or life-threatening deterioration. Critical care was time spent personally by me on the following activities: development of treatment plan with patient and/or surrogate as well as nursing, discussions with consultants, evaluation of patient's response to treatment, examination of patient, obtaining history from patient or surrogate, ordering and performing treatments and interventions, ordering and review of laboratory studies, ordering and review of radiographic studies, pulse oximetry and re-evaluation of patient's condition.  Nanda Quinton, MD Emergency Medicine  ____________________________________________   INITIAL IMPRESSION / ASSESSMENT AND PLAN / ED COURSE  Pertinent labs & imaging results that were available during my care of the patient were reviewed by me and considered in my medical decision making (see chart for details).   Patient presents to the emergency department with acute onset vision change.  Had balance difficulty may be related to vision disturbance but cannot rule out central process.  Patient appears to have an isolated 3rd nerve palsy on my initial exam. Sending CVA labs and CT head. No eye pain. Vision in the left eye normalizes when covering the right eye.   CT imaging and labs reviewed. No acute findings. Allow for permissive HTN at this time. Discussed the case with Tele-Neuro. Agree with plan for CVA w/u and admit. Recommend MRI brain w/ and w/o contrast with cancer history.   Discussed patient's case with [facility], Dr. Denton Brick to request admission. Patient and family (if present) updated with plan. Care transferred to [facility] service.  I reviewed all nursing notes, vitals, pertinent old records, EKGs, labs, imaging (as available).  ____________________________________________  FINAL CLINICAL IMPRESSION(S) / ED DIAGNOSES  Final  diagnoses:  Left oculomotor nerve palsy     MEDICATIONS GIVEN DURING THIS VISIT:  Medications  nicotine (NICODERM CQ - dosed in mg/24 hours) patch 21 mg (21 mg Transdermal Patch Applied 01/29/19 1508)     Note:  This document was prepared using Dragon voice recognition software and may include unintentional dictation errors.  Nanda Quinton, MD, [facility] Emergency Medicine    Long, [place], MD 01/29/19 [phone]

## 2019-01-30 ENCOUNTER — Observation Stay (HOSPITAL_COMMUNITY): Payer: Medicare Other

## 2019-01-30 DIAGNOSIS — H539 Unspecified visual disturbance: Secondary | ICD-10-CM | POA: Diagnosis not present

## 2019-01-30 DIAGNOSIS — H4901 Third [oculomotor] nerve palsy, right eye: Secondary | ICD-10-CM

## 2019-01-30 DIAGNOSIS — I6523 Occlusion and stenosis of bilateral carotid arteries: Secondary | ICD-10-CM | POA: Diagnosis not present

## 2019-01-30 LAB — LIPID PANEL
Cholesterol: 177 mg/dL (ref 0–200)
HDL: 52 mg/dL (ref >40)
LDL Cholesterol: 104 mg/dL — ABNORMAL HIGH (ref 0–99)
Total CHOL/HDL Ratio: 3.4 RATIO
Triglycerides: 106 mg/dL (ref <150)
VLDL: 21 mg/dL (ref 0–40)

## 2019-01-30 LAB — VITAMIN B12: Vitamin B-12: 483 pg/mL (ref 180–914)

## 2019-01-30 LAB — TSH: TSH: 3.406 u[IU]/mL (ref 0.350–4.500)

## 2019-01-30 LAB — HEMOGLOBIN A1C
Hgb A1c MFr Bld: 5 % (ref 4.8–5.6)
Mean Plasma Glucose: 96.8 mg/dL

## 2019-01-30 LAB — SARS CORONAVIRUS 2 (TAT 6-24 HRS): SARS Coronavirus 2: NEGATIVE

## 2019-01-30 MED ORDER — ASPIRIN 81 MG PO TBEC: 81.0000 mg | DELAYED_RELEASE_TABLET | Freq: Every day | ORAL | Status: DC

## 2019-01-30 MED ORDER — GADOBUTROL 1 MMOL/ML IV SOLN
7.0000 mL | Freq: Once | INTRAVENOUS | Status: AC | PRN
  Administered 2019-01-30: 7 mL via INTRAVENOUS

## 2019-01-30 NOTE — Discharge Summary (Signed)
Physician Discharge Summary  Thomas Davenport:[id number] DOB: 29-Apr-1945 DOA: 01/29/2019  PCP: Thomas Gouty, FNP  Admit date: 01/29/2019 Discharge date: 01/30/2019  Admitted From: Home Disposition:  Home  Recommendations for Outpatient Follow-up:  1. Follow up with PCP in 1-2 weeks 2. Please obtain BMP/CBC in one week   Discharge Condition: Stable CODE STATUS: FULL Diet recommendation: Heart Healthy   Brief/Interim Summary: 73 year old male with a history of lung cancer, COPD, GI bleed, GERD presenting with visual disturbance that began at 8 PM on 01/28/2019.  The patient states that he was " seeing triple", but denied any frank vision loss.  When he woke up on the morning of 01/29/2019, he felt that his balance was off and he had some gait instability.  He denies any focal extremity weakness, dysarthria, dysphasia, or sensory disturbance.  He denies any dysphagia or odynophagia.  He denied any fevers, chills, headache, neck pain, chest pain, shortness of breath, coughing, hemoptysis nausea, vomiting, diarrhea, abdominal pain, dysuria, hematochezia, melena.   In the emergency department, the patient was noted to have a pressure of 207/79, he was afebrile with oxygen saturation 98% on room air.  BMP and LFTs were unremarkable.  CBC was unremarkable.  CT of the brain was negative for acute findings.  Urine drug screen was negative.  EKG shows sinus rhythm with nonspecific T wave changes.  Patient was admitted for further evaluation work-up.  MRI of the brain was neg for acute stroke or hemorrhage.  There was no metastasis.  MRI of the brain was negative for LVO.  MRI of the neck was negative for any hemodynamic significant stenosis.  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  I discussed secondary prophylaxis with the patient and daughter including controlling his high blood pressure, hyperlipidemia and starting antiplatelet agents.  The  patient and daughter adamantly refused for the patient to start any medications for secondary prophylaxis at this time.  I have explained all the risks, benefits, and alternatives including but not limited to large territory stroke and debilitation, hemiparesis, and death.  The patient and daughter expressed understanding and continued to refuse any medications for secondary prophylaxis including aspirin, statin, and antihypertensive medications.   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 -pt and daughter refuse all medications for secondary prophylaxis--see above -PT/OT evaluation--pt refused -Speech therapy eval -CT brain--neg -MRI brain--neg for acute stroke or hemorrhage -MRA brain--neg LVO -MRA neck--neg for hemodynamically significant stenosis -Echo--results pending at time of d/c -LDL--104 -HbA1C--pending at time of d/c -Antiplatelet--ASA 81 mg which patient refuses  Essential hypertension -Allowing for permissive hypertension during hospitalization -pt refuses any anti-HTN at time of d/c -Hydralazine prn SBP>220  Lung cancer  -Patient had PET scan on 05/23/2018 showing 2 hypermetabolic parotid nodules as well as a left lower lobe hypermetabolic lung nodule -He does not want any further work-up or treatment  COPD -Stable on room air  Tobacco abuse I have discussed tobacco cessation with the patient.  I have counseled the patient regarding the negative impacts of continued tobacco use including but not limited to lung cancer, COPD, and cardiovascular disease.  I have discussed alternatives to tobacco and modalities that may help facilitate tobacco cessation including but not limited to biofeedback, hypnosis, and medications.  Total time spent with tobacco counseling was 4 minutes.  Goals of care -The patient and daughter have made it clear that he does not  want any invasive or extensive work-up -He states that he is unable  and does not want to take any antiplatelet agents at this time because he feels that he bleeds easily and his blood is too thin.  He understands all the risks, benefits, alternatives.    Discharge Instructions  Discharge Instructions    Ambulatory referral to Neurology   Complete by: As directed    An appointment is requested in approximately: 4 weeks for isolated CN 3 palsy     Allergies as of 01/30/2019      Reactions   Pineapple Nausea And Vomiting   Tape Itching, Other (See Comments)   *Paper tape to be used only- please*      Medication List    TAKE these medications   aspirin 81 MG EC tablet Take 1 tablet (81 mg total) by mouth daily. Start taking on: January 31, 2019   Flintstones Plus Iron chewable tablet Chew 1 tablet by mouth 2 (two) times daily.   meclizine 25 MG tablet Commonly known as: ANTIVERT Take 25 mg by mouth 3 (three) times daily as needed for dizziness.   pantoprazole 40 MG tablet Commonly known as: PROTONIX Take 1 tablet (40 mg total) by mouth daily before breakfast.   psyllium 58.6 % powder Commonly known as: Metamucil Smooth Texture Take 1 packet by mouth daily.   VITAMIN B-12 PO Take 1 tablet by mouth daily.       Allergies  Allergen Reactions   Pineapple Nausea And Vomiting   Tape Itching and Other (See Comments)    *Paper tape to be used only- please*    Consultations:  Neurology consult offered--pt did not want to wait   Procedures/Studies: Ct Head Wo Contrast  Result Date: 01/29/2019 CLINICAL DATA:  Patient was watching television about 8 pm last night and had triple vision. He did not tell his wife until this am. Pt was weak and not able to stand or walk. EXAM: CT HEAD WITHOUT CONTRAST TECHNIQUE: Contiguous axial images were obtained from the base of the skull through the vertex without intravenous contrast. COMPARISON:  03/07/2014 FINDINGS: Brain: No evidence of acute infarction, hemorrhage, extra-axial collection,  ventriculomegaly, or mass effect. Generalized cerebral atrophy. Periventricular white matter low attenuation likely secondary to microangiopathy. Vascular: Cerebrovascular atherosclerotic calcifications are noted. Skull: Negative for fracture or focal lesion. Sinuses/Orbits: Visualized portions of the orbits are unremarkable. Visualized portions of the paranasal sinuses are unremarkable. Visualized portions of the mastoid air cells are unremarkable. Other: None. IMPRESSION: 1. No acute intracranial pathology. 2. Chronic microvascular disease and cerebral atrophy. Electronically Signed   By: Kathreen Devoid   On: 01/29/2019 13:29   Mr Angio Head Wo Contrast  Result Date: 01/30/2019 CLINICAL DATA:  Vision changes EXAM: MRI HEAD WITHOUT AND WITH CONTRAST MRA HEAD WITHOUT CONTRAST MRA NECK WITHOUT AND WITH CONTRAST TECHNIQUE: Multiplanar, multiecho pulse sequences of the brain and surrounding structures were obtained without and with intravenous contrast. Angiographic images of the Circle of Willis were obtained using MRA technique without intravenous contrast. Angiographic images of the neck were obtained using MRA technique without and with intravenous contrast. Carotid stenosis measurements (when applicable) are obtained utilizing NASCET criteria, using the distal internal carotid diameter as the denominator. CONTRAST:  35mL GADAVIST GADOBUTROL 1 MMOL/ML IV SOLN COMPARISON:  None. FINDINGS: MRI HEAD FINDINGS Brain: There is no acute infarction or intracranial hemorrhage. There is no intracranial mass, mass effect, edema, or extra-axial fluid collection. Prominence of the ventricles and sulci reflects parenchymal  volume loss. Patchy and confluent areas of T2 hyperintensity in the supratentorial white matter are non-specific but probably reflect moderate chronic microvascular ischemic changes. There are chronic small vessel infarcts of the basal ganglia and thalamus. Vascular: Major vessel flow voids at the skull base  are preserved. Skull and upper cervical spine: Marrow signal is within normal limits. Sinuses/Orbits: Minor paranasal sinus mucosal thickening. Bilateral lens replacements. Other: None. MRA HEAD FINDINGS Intracranial internal carotid arteries are patent with atherosclerotic irregularity along the cavernous segments. There is a patent left posterior communicating artery. Middle and anterior cerebral arteries are patent. An anterior communicating artery is present. Intracranial vertebral, basilar, and posterior cerebral arteries are patent. There is no significant stenosis or aneurysm. MRA NECK FINDINGS Common carotid, internal carotid, external carotid arteries are patent. Plaque at the right ICA origin causes less than 50% stenosis. Plaque at the left ICA origin causes approximately 50% stenosis. Extracranial vertebral arteries are patent. The left vertebral artery is dominant. There is stenosis at the origins, at least moderate. IMPRESSION: No evidence of recent infarction, intracranial hemorrhage, or mass. Moderate chronic microvascular ischemic changes. Chronic small vessel infarct. No proximal intracranial vessel occlusion. Atherosclerotic plaque at the ICA origins causing less than 50% stenosis on the right and approximately 50% stenosis on the left. Electronically Signed   By: Macy Mis M.D.   On: 01/30/2019 10:04   Mr Angio Neck W Wo Contrast  Result Date: 01/30/2019 CLINICAL DATA:  Vision changes EXAM: MRI HEAD WITHOUT AND WITH CONTRAST MRA HEAD WITHOUT CONTRAST MRA NECK WITHOUT AND WITH CONTRAST TECHNIQUE: Multiplanar, multiecho pulse sequences of the brain and surrounding structures were obtained without and with intravenous contrast. Angiographic images of the Circle of Willis were obtained using MRA technique without intravenous contrast. Angiographic images of the neck were obtained using MRA technique without and with intravenous contrast. Carotid stenosis measurements (when applicable) are  obtained utilizing NASCET criteria, using the distal internal carotid diameter as the denominator. CONTRAST:  60mL GADAVIST GADOBUTROL 1 MMOL/ML IV SOLN COMPARISON:  None. FINDINGS: MRI HEAD FINDINGS Brain: There is no acute infarction or intracranial hemorrhage. There is no intracranial mass, mass effect, edema, or extra-axial fluid collection. Prominence of the ventricles and sulci reflects parenchymal volume loss. Patchy and confluent areas of T2 hyperintensity in the supratentorial white matter are non-specific but probably reflect moderate chronic microvascular ischemic changes. There are chronic small vessel infarcts of the basal ganglia and thalamus. Vascular: Major vessel flow voids at the skull base are preserved. Skull and upper cervical spine: Marrow signal is within normal limits. Sinuses/Orbits: Minor paranasal sinus mucosal thickening. Bilateral lens replacements. Other: None. MRA HEAD FINDINGS Intracranial internal carotid arteries are patent with atherosclerotic irregularity along the cavernous segments. There is a patent left posterior communicating artery. Middle and anterior cerebral arteries are patent. An anterior communicating artery is present. Intracranial vertebral, basilar, and posterior cerebral arteries are patent. There is no significant stenosis or aneurysm. MRA NECK FINDINGS Common carotid, internal carotid, external carotid arteries are patent. Plaque at the right ICA origin causes less than 50% stenosis. Plaque at the left ICA origin causes approximately 50% stenosis. Extracranial vertebral arteries are patent. The left vertebral artery is dominant. There is stenosis at the origins, at least moderate. IMPRESSION: No evidence of recent infarction, intracranial hemorrhage, or mass. Moderate chronic microvascular ischemic changes. Chronic small vessel infarct. No proximal intracranial vessel occlusion. Atherosclerotic plaque at the ICA origins causing less than 50% stenosis on the right  and approximately 50% stenosis  on the left. Electronically Signed   By: Macy Mis M.D.   On: 01/30/2019 10:04   Mr Jeri Cos OV Contrast  Result Date: 01/30/2019 CLINICAL DATA:  Vision changes EXAM: MRI HEAD WITHOUT AND WITH CONTRAST MRA HEAD WITHOUT CONTRAST MRA NECK WITHOUT AND WITH CONTRAST TECHNIQUE: Multiplanar, multiecho pulse sequences of the brain and surrounding structures were obtained without and with intravenous contrast. Angiographic images of the Circle of Willis were obtained using MRA technique without intravenous contrast. Angiographic images of the neck were obtained using MRA technique without and with intravenous contrast. Carotid stenosis measurements (when applicable) are obtained utilizing NASCET criteria, using the distal internal carotid diameter as the denominator. CONTRAST:  56mL GADAVIST GADOBUTROL 1 MMOL/ML IV SOLN COMPARISON:  None. FINDINGS: MRI HEAD FINDINGS Brain: There is no acute infarction or intracranial hemorrhage. There is no intracranial mass, mass effect, edema, or extra-axial fluid collection. Prominence of the ventricles and sulci reflects parenchymal volume loss. Patchy and confluent areas of T2 hyperintensity in the supratentorial white matter are non-specific but probably reflect moderate chronic microvascular ischemic changes. There are chronic small vessel infarcts of the basal ganglia and thalamus. Vascular: Major vessel flow voids at the skull base are preserved. Skull and upper cervical spine: Marrow signal is within normal limits. Sinuses/Orbits: Minor paranasal sinus mucosal thickening. Bilateral lens replacements. Other: None. MRA HEAD FINDINGS Intracranial internal carotid arteries are patent with atherosclerotic irregularity along the cavernous segments. There is a patent left posterior communicating artery. Middle and anterior cerebral arteries are patent. An anterior communicating artery is present. Intracranial vertebral, basilar, and posterior  cerebral arteries are patent. There is no significant stenosis or aneurysm. MRA NECK FINDINGS Common carotid, internal carotid, external carotid arteries are patent. Plaque at the right ICA origin causes less than 50% stenosis. Plaque at the left ICA origin causes approximately 50% stenosis. Extracranial vertebral arteries are patent. The left vertebral artery is dominant. There is stenosis at the origins, at least moderate. IMPRESSION: No evidence of recent infarction, intracranial hemorrhage, or mass. Moderate chronic microvascular ischemic changes. Chronic small vessel infarct. No proximal intracranial vessel occlusion. Atherosclerotic plaque at the ICA origins causing less than 50% stenosis on the right and approximately 50% stenosis on the left. Electronically Signed   By: Macy Mis M.D.   On: 01/30/2019 10:04        Discharge Exam: Vitals:   01/30/19 0154 01/30/19 0504  BP: (!) 142/76 (!) 170/76  Pulse: 65 67  Resp: 20 20  Temp: 98.7 F (37.1 C) 98.1 F (36.7 C)  SpO2: 98% 98%   Vitals:   01/29/19 1918 01/29/19 2212 01/30/19 0154 01/30/19 0504  BP: (!) 163/83 (!) 158/68 (!) 142/76 (!) 170/76  Pulse: 69 66 65 67  Resp: 20 20 20 20   Temp: 98.4 F (36.9 C) 98.3 F (36.8 C) 98.7 F (37.1 C) 98.1 F (36.7 C)  TempSrc: Oral Oral Oral Oral  SpO2: 97% 98% 98% 98%  Weight:      Height:        General: Pt is alert, awake, not in acute distress Cardiovascular: RRR, S1/S2 +, no rubs, no gallops Respiratory: CTA bilaterally, no wheezing, no rhonchi Abdominal: Soft, NT, ND, bowel sounds + Extremities: no edema, no cyanosis Neuro:  CN III on R not intact. strength 4/5 in RUE, RLE, strength 4/5 LUE, LLE; sensation intact bilateral; no dysmetria; babinski equivocal    The results of significant diagnostics from this hospitalization (including imaging, microbiology, ancillary and laboratory) are listed  below for reference.    Significant Diagnostic Studies: Ct Head Wo  Contrast  Result Date: 01/29/2019 CLINICAL DATA:  Patient was watching television about 8 pm last night and had triple vision. He did not tell his wife until this am. Pt was weak and not able to stand or walk. EXAM: CT HEAD WITHOUT CONTRAST TECHNIQUE: Contiguous axial images were obtained from the base of the skull through the vertex without intravenous contrast. COMPARISON:  03/07/2014 FINDINGS: Brain: No evidence of acute infarction, hemorrhage, extra-axial collection, ventriculomegaly, or mass effect. Generalized cerebral atrophy. Periventricular white matter low attenuation likely secondary to microangiopathy. Vascular: Cerebrovascular atherosclerotic calcifications are noted. Skull: Negative for fracture or focal lesion. Sinuses/Orbits: Visualized portions of the orbits are unremarkable. Visualized portions of the paranasal sinuses are unremarkable. Visualized portions of the mastoid air cells are unremarkable. Other: None. IMPRESSION: 1. No acute intracranial pathology. 2. Chronic microvascular disease and cerebral atrophy. Electronically Signed   By: Kathreen Devoid   On: 01/29/2019 13:29   Mr Angio Head Wo Contrast  Result Date: 01/30/2019 CLINICAL DATA:  Vision changes EXAM: MRI HEAD WITHOUT AND WITH CONTRAST MRA HEAD WITHOUT CONTRAST MRA NECK WITHOUT AND WITH CONTRAST TECHNIQUE: Multiplanar, multiecho pulse sequences of the brain and surrounding structures were obtained without and with intravenous contrast. Angiographic images of the Circle of Willis were obtained using MRA technique without intravenous contrast. Angiographic images of the neck were obtained using MRA technique without and with intravenous contrast. Carotid stenosis measurements (when applicable) are obtained utilizing NASCET criteria, using the distal internal carotid diameter as the denominator. CONTRAST:  78mL GADAVIST GADOBUTROL 1 MMOL/ML IV SOLN COMPARISON:  None. FINDINGS: MRI HEAD FINDINGS Brain: There is no acute infarction or  intracranial hemorrhage. There is no intracranial mass, mass effect, edema, or extra-axial fluid collection. Prominence of the ventricles and sulci reflects parenchymal volume loss. Patchy and confluent areas of T2 hyperintensity in the supratentorial white matter are non-specific but probably reflect moderate chronic microvascular ischemic changes. There are chronic small vessel infarcts of the basal ganglia and thalamus. Vascular: Major vessel flow voids at the skull base are preserved. Skull and upper cervical spine: Marrow signal is within normal limits. Sinuses/Orbits: Minor paranasal sinus mucosal thickening. Bilateral lens replacements. Other: None. MRA HEAD FINDINGS Intracranial internal carotid arteries are patent with atherosclerotic irregularity along the cavernous segments. There is a patent left posterior communicating artery. Middle and anterior cerebral arteries are patent. An anterior communicating artery is present. Intracranial vertebral, basilar, and posterior cerebral arteries are patent. There is no significant stenosis or aneurysm. MRA NECK FINDINGS Common carotid, internal carotid, external carotid arteries are patent. Plaque at the right ICA origin causes less than 50% stenosis. Plaque at the left ICA origin causes approximately 50% stenosis. Extracranial vertebral arteries are patent. The left vertebral artery is dominant. There is stenosis at the origins, at least moderate. IMPRESSION: No evidence of recent infarction, intracranial hemorrhage, or mass. Moderate chronic microvascular ischemic changes. Chronic small vessel infarct. No proximal intracranial vessel occlusion. Atherosclerotic plaque at the ICA origins causing less than 50% stenosis on the right and approximately 50% stenosis on the left. Electronically Signed   By: Macy Mis M.D.   On: 01/30/2019 10:04   Mr Angio Neck W Wo Contrast  Result Date: 01/30/2019 CLINICAL DATA:  Vision changes EXAM: MRI HEAD WITHOUT AND WITH  CONTRAST MRA HEAD WITHOUT CONTRAST MRA NECK WITHOUT AND WITH CONTRAST TECHNIQUE: Multiplanar, multiecho pulse sequences of the brain and surrounding structures were obtained without  and with intravenous contrast. Angiographic images of the Circle of Willis were obtained using MRA technique without intravenous contrast. Angiographic images of the neck were obtained using MRA technique without and with intravenous contrast. Carotid stenosis measurements (when applicable) are obtained utilizing NASCET criteria, using the distal internal carotid diameter as the denominator. CONTRAST:  74mL GADAVIST GADOBUTROL 1 MMOL/ML IV SOLN COMPARISON:  None. FINDINGS: MRI HEAD FINDINGS Brain: There is no acute infarction or intracranial hemorrhage. There is no intracranial mass, mass effect, edema, or extra-axial fluid collection. Prominence of the ventricles and sulci reflects parenchymal volume loss. Patchy and confluent areas of T2 hyperintensity in the supratentorial white matter are non-specific but probably reflect moderate chronic microvascular ischemic changes. There are chronic small vessel infarcts of the basal ganglia and thalamus. Vascular: Major vessel flow voids at the skull base are preserved. Skull and upper cervical spine: Marrow signal is within normal limits. Sinuses/Orbits: Minor paranasal sinus mucosal thickening. Bilateral lens replacements. Other: None. MRA HEAD FINDINGS Intracranial internal carotid arteries are patent with atherosclerotic irregularity along the cavernous segments. There is a patent left posterior communicating artery. Middle and anterior cerebral arteries are patent. An anterior communicating artery is present. Intracranial vertebral, basilar, and posterior cerebral arteries are patent. There is no significant stenosis or aneurysm. MRA NECK FINDINGS Common carotid, internal carotid, external carotid arteries are patent. Plaque at the right ICA origin causes less than 50% stenosis. Plaque at  the left ICA origin causes approximately 50% stenosis. Extracranial vertebral arteries are patent. The left vertebral artery is dominant. There is stenosis at the origins, at least moderate. IMPRESSION: No evidence of recent infarction, intracranial hemorrhage, or mass. Moderate chronic microvascular ischemic changes. Chronic small vessel infarct. No proximal intracranial vessel occlusion. Atherosclerotic plaque at the ICA origins causing less than 50% stenosis on the right and approximately 50% stenosis on the left. Electronically Signed   By: Macy Mis M.D.   On: 01/30/2019 10:04   Mr Jeri Cos RC Contrast  Result Date: 01/30/2019 CLINICAL DATA:  Vision changes EXAM: MRI HEAD WITHOUT AND WITH CONTRAST MRA HEAD WITHOUT CONTRAST MRA NECK WITHOUT AND WITH CONTRAST TECHNIQUE: Multiplanar, multiecho pulse sequences of the brain and surrounding structures were obtained without and with intravenous contrast. Angiographic images of the Circle of Willis were obtained using MRA technique without intravenous contrast. Angiographic images of the neck were obtained using MRA technique without and with intravenous contrast. Carotid stenosis measurements (when applicable) are obtained utilizing NASCET criteria, using the distal internal carotid diameter as the denominator. CONTRAST:  57mL GADAVIST GADOBUTROL 1 MMOL/ML IV SOLN COMPARISON:  None. FINDINGS: MRI HEAD FINDINGS Brain: There is no acute infarction or intracranial hemorrhage. There is no intracranial mass, mass effect, edema, or extra-axial fluid collection. Prominence of the ventricles and sulci reflects parenchymal volume loss. Patchy and confluent areas of T2 hyperintensity in the supratentorial white matter are non-specific but probably reflect moderate chronic microvascular ischemic changes. There are chronic small vessel infarcts of the basal ganglia and thalamus. Vascular: Major vessel flow voids at the skull base are preserved. Skull and upper cervical  spine: Marrow signal is within normal limits. Sinuses/Orbits: Minor paranasal sinus mucosal thickening. Bilateral lens replacements. Other: None. MRA HEAD FINDINGS Intracranial internal carotid arteries are patent with atherosclerotic irregularity along the cavernous segments. There is a patent left posterior communicating artery. Middle and anterior cerebral arteries are patent. An anterior communicating artery is present. Intracranial vertebral, basilar, and posterior cerebral arteries are patent. There is no significant stenosis or  aneurysm. MRA NECK FINDINGS Common carotid, internal carotid, external carotid arteries are patent. Plaque at the right ICA origin causes less than 50% stenosis. Plaque at the left ICA origin causes approximately 50% stenosis. Extracranial vertebral arteries are patent. The left vertebral artery is dominant. There is stenosis at the origins, at least moderate. IMPRESSION: No evidence of recent infarction, intracranial hemorrhage, or mass. Moderate chronic microvascular ischemic changes. Chronic small vessel infarct. No proximal intracranial vessel occlusion. Atherosclerotic plaque at the ICA origins causing less than 50% stenosis on the right and approximately 50% stenosis on the left. Electronically Signed   By: Macy Mis M.D.   On: 01/30/2019 10:04     Microbiology: Recent Results (from the past 240 hour(s))  SARS CORONAVIRUS 2 (Samya Siciliano 6-24 HRS) Nasopharyngeal Nasopharyngeal Swab     Status: None   Collection Time: 01/29/19  3:27 PM   Specimen: Nasopharyngeal Swab  Result Value Ref Range Status   SARS Coronavirus 2 NEGATIVE NEGATIVE Final    Comment: (NOTE) SARS-CoV-2 target nucleic acids are NOT DETECTED. The SARS-CoV-2 RNA is generally detectable in upper and lower respiratory specimens during the acute phase of infection. Negative results do not preclude SARS-CoV-2 infection, do not rule out co-infections with other pathogens, and should not be used as the sole  basis for treatment or other patient management decisions. Negative results must be combined with clinical observations, patient history, and epidemiological information. The expected result is Negative. Fact Sheet for Patients: SugarRoll.be Fact Sheet for Healthcare Providers: https://www.woods-mathews.com/ This test is not yet approved or cleared by the [place] FDA and  has been authorized for detection and/or diagnosis of SARS-CoV-2 by FDA under an Emergency Use Authorization (EUA). This EUA will remain  in effect (meaning this test can be used) for the duration of the COVID-19 declaration under Section 56 4(b)(1) of the Act, 21 U.S.C. section 360bbb-3(b)(1), unless the authorization is terminated or revoked sooner. Performed at [facility], [place] [address] [place], [place] [address]      Labs: Basic Metabolic Panel: Recent Labs  Lab 01/29/19 1219  NA 135  K 3.6  CL 100  CO2 27  GLUCOSE 99  BUN 11  CREATININE 1.10  CALCIUM 8.9   Liver Function Tests: Recent Labs  Lab 01/29/19 1219  AST 15  ALT 8  ALKPHOS 71  BILITOT 0.9  PROT 6.7  ALBUMIN 3.7   No results for input(s): LIPASE, AMYLASE in the last 168 hours. No results for input(s): AMMONIA in the last 168 hours. CBC: Recent Labs  Lab 01/29/19 1219  WBC 4.2  NEUTROABS 2.8  HGB 12.1*  HCT 35.9*  MCV 109.8*  PLT 199   Cardiac Enzymes: No results for input(s): CKTOTAL, CKMB, CKMBINDEX, TROPONINI in the last 168 hours. BNP: Invalid input(s): POCBNP CBG: No results for input(s): GLUCAP in the last 168 hours.  Time coordinating discharge:  36 minutes  Signed:  Orson Eva, DO [facility] Pager: [phone] 01/30/2019, 12:23 PM

## 2019-01-30 NOTE — CV Procedure (Signed)
Patient refusing echocardiogram; ready to go home. Nurse aware.

## 2019-01-30 NOTE — Evaluation (Signed)
Physical Therapy Evaluation Patient Details Name: Thomas Davenport MRN: [id number] DOB: Aug 10, 1945 Today's Date: 01/30/2019   History of Present Illness  Thomas Davenport is a 73 y.o. male with medical history significant for lung cancer, COPD, alcohol use, hypertension, presented to the ED with complaints of sudden change in vision noticed at 8 PM last night.  Patient reports triple vision since last night, he went to bed woke up this morning with same symptoms, and difficulty with balance.  Patient spouse reported that patient was wobbly.No prior history of strokes.  Denies weakness of his extremities.  Denies being on aspirin.  At the time of my evaluation he tells me that his double vision has resolved.    Clinical Impression  Patient functioning near baseline for functional mobility and gait other than having triple vision which has improved per patient and can see normally when he concentrate on focusing, demonstrates slightly ataxic like gait which is normal per patient's daughter who is present at bedside.  Plan:  Patient discharged from physical therapy to care of nursing for ambulation daily as tolerated for length of stay.     Follow Up Recommendations Supervision - Intermittent;Supervision for mobility/OOB    Equipment Recommendations  None recommended by PT    Recommendations for Other Services       Precautions / Restrictions Precautions Precautions: Fall Restrictions Weight Bearing Restrictions: No      Mobility  Bed Mobility Overal bed mobility: Modified Independent             General bed mobility comments: slightly increased time  Transfers Overall transfer level: Needs assistance Equipment used: None Transfers: Sit to/from Stand;Stand Pivot Transfers Sit to Stand: Supervision Stand pivot transfers: Supervision       General transfer comment: slightly labored movement  Ambulation/Gait Ambulation/Gait assistance: Supervision;Min guard Gait Distance  (Feet): 30 Feet Assistive device: None Gait Pattern/deviations: Decreased step length - right;Decreased step length - left;Decreased stride length;Ataxic Gait velocity: decreased   General Gait Details: slightly labored ataxic like gait without loss of balance, limited secondary to c/o fatigue and back stiffness  Stairs            Wheelchair Mobility    Modified Rankin (Stroke Patients Only)       Balance Overall balance assessment: Mild deficits observed, not formally tested                                           Pertinent Vitals/Pain Pain Assessment: No/denies pain    Home Living Family/patient expects to be discharged to:: Private residence Living Arrangements: Spouse/significant other Available Help at Discharge: Family;Available 24 hours/day Type of Home: House Home Access: Level entry     Home Layout: One level Home Equipment: Cane - single point;Walker - 2 wheels;Wheelchair - [employer];Shower seat      Prior Function Level of Independence: Independent         Comments: community ambulator, drives     Hand Dominance   Dominant Hand: Left    Extremity/Trunk Assessment   Upper Extremity Assessment Upper Extremity Assessment: Overall WFL for tasks assessed    Lower Extremity Assessment Lower Extremity Assessment: Overall WFL for tasks assessed    Cervical / Trunk Assessment Cervical / Trunk Assessment: Normal  Communication   Communication: No difficulties  Cognition Arousal/Alertness: Awake/alert Behavior During Therapy: WFL for tasks assessed/performed Overall Cognitive Status: Within  Functional Limits for tasks assessed                                        General Comments      Exercises     Assessment/Plan    PT Assessment Patent does not need any further PT services  PT Problem List         PT Treatment Interventions      PT Goals (Current goals can be found in the Care  Plan section)  Acute Rehab PT Goals Patient Stated Goal: return home with family to assist PT Goal Formulation: With patient/family Time For Goal Achievement: 01/30/19 Potential to Achieve Goals: Good    Frequency     Barriers to discharge        Co-evaluation               AM-PAC PT "6 Clicks" Mobility  Outcome Measure Help needed turning from your back to your side while in a flat bed without using bedrails?: None Help needed moving from lying on your back to sitting on the side of a flat bed without using bedrails?: None Help needed moving to and from a bed to a chair (including a wheelchair)?: None Help needed standing up from a chair using your arms (e.g., wheelchair or bedside chair)?: None Help needed to walk in hospital room?: A Little Help needed climbing 3-5 steps with a railing? : A Little 6 Click Score: 22    End of Session Equipment Utilized During Treatment: Gait belt Activity Tolerance: Patient tolerated treatment well;Patient limited by fatigue Patient left: in bed;with call bell/phone within reach;with family/visitor present(seated at bedside) Nurse Communication: Mobility status PT Visit Diagnosis: Unsteadiness on feet (R26.81);Other abnormalities of gait and mobility (R26.89)    Time: 3729-0211 PT Time Calculation (min) (ACUTE ONLY): 20 min   Charges:   PT Evaluation $PT Eval Moderate Complexity: 1 Mod PT Treatments $Therapeutic Activity: 8-22 mins        2:01 PM, 01/30/19 Lonell Grandchild, MPT Physical Therapist with [facility] [phone] office [phone] mobile phone

## 2019-01-31 ENCOUNTER — Encounter: Payer: Self-pay | Admitting: Neurology

## 2019-02-03 ENCOUNTER — Other Ambulatory Visit: Payer: Self-pay

## 2019-02-06 ENCOUNTER — Ambulatory Visit (INDEPENDENT_AMBULATORY_CARE_PROVIDER_SITE_OTHER): Payer: Medicare Other | Admitting: Family Medicine

## 2019-02-06 ENCOUNTER — Other Ambulatory Visit: Payer: Self-pay

## 2019-02-06 ENCOUNTER — Encounter: Payer: Self-pay | Admitting: Family Medicine

## 2019-02-06 VITALS — BP 165/70 | HR 85 | Temp 98.7°F | Ht 72.0 in | Wt 149.0 lb

## 2019-02-06 DIAGNOSIS — K5904 Chronic idiopathic constipation: Secondary | ICD-10-CM | POA: Diagnosis not present

## 2019-02-06 DIAGNOSIS — Z09 Encounter for follow-up examination after completed treatment for conditions other than malignant neoplasm: Secondary | ICD-10-CM | POA: Diagnosis not present

## 2019-02-06 DIAGNOSIS — I10 Essential (primary) hypertension: Secondary | ICD-10-CM

## 2019-02-06 DIAGNOSIS — D649 Anemia, unspecified: Secondary | ICD-10-CM

## 2019-02-06 DIAGNOSIS — H4900 Third [oculomotor] nerve palsy, unspecified eye: Secondary | ICD-10-CM

## 2019-02-06 MED ORDER — AMLODIPINE BESYLATE 5 MG PO TABS: ORAL_TABLET | ORAL | 3 refills | Status: DC

## 2019-02-06 NOTE — Patient Instructions (Signed)
You had labs performed today.  You will be contacted with the results of the labs once they are available, usually in the next 3 business days for routine lab work.  If you have an active my chart account, they will be released to your MyChart.  If you prefer to have these labs released to you via telephone, please let us know.  If you had a pap smear or biopsy performed, expect to be contacted in about 7-10 days.  Thank you for coming in to clinic today.  1. Your symptoms are consistent with Constipation, likely cause of your General Abdominal Pain / Cramping. 2. Start with Miralax sent to pharmacy. First dose 68g (4 capfuls) in 32oz water over 1 to 2 hours for clean out. Next day start 17g or 1 capful daily, may adjust dose up or down by half a capful every few days. Recommend to take this medicine daily for next 1-2 weeks, then may need to use it longer if needed. - Goal is to have soft regular bowel movement 1-3x daily, if too runny or diarrhea, then reduce dose of the medicine  Improve water intake, hydration will help Also recommend increased vegetables, fruits, fiber intake Can try daily Metamucil or Fiber supplement at pharmacy over the counter  Follow-up if symptoms are not improving with bowel movements, or if pain worsens, develop fevers, nausea, vomiting.  If you have any other questions or concerns, please feel free to call the clinic to contact me. You may also schedule an earlier appointment if necessary.  However, if your symptoms get significantly worse, please go to the Emergency Department to seek immediate medical attention.

## 2019-02-06 NOTE — Progress Notes (Signed)
Subjective: CC: Hospital discharge follow-up PCP: Baruch Gouty, FNP Thomas Davenport is a 73 y.o. male presenting to clinic today for:  1.  Cranial nerve III palsy Patient was admitted to the hospital with visual disturbance.  He had evaluation for possible CVA which was ultimately negative.  He was noted to have accelerated hypertension to 207/79.  The palsy was thought to be secondary to ischemia in the setting of uncontrolled hypertension.  Secondary prophylaxis was recommended including control of blood pressure, aspirin and statin but the family refused any medications.  He is accompanied to the office by his 2 daughters today.  They actually go on to inform me that he in fact would be open to some medications but they were very reluctant to start him on anything that would further thin his blood.  He has a history of GI bleed in the past and they worry about this occurring again.  He has been taking half a dose of baby aspirin in efforts to be compliant but he is bruised "from head to toe".  He was diagnosed with a lung cancer earlier this year and has no intention of pursuing treatment for this.  He continues to smoke.  With regards to the blood pressure they would be open to starting a blood pressure medication so long as it would not precipitate significant dizziness or falls.  They are not quite sure that he wants to start any cholesterol medicine.  He is very reluctant to start any medications at all but does indeed want his visual disturbance to continue improving.  He does not report any chest pain, shortness of breath.  He would like to establish with neurology, Dr. Jannifer Franklin specifically.  2.  Chronic constipation Patient reports ongoing issues with constipation.  He notes that he will go several days without bowel movements.  He is tried naturopathic things like prunes, increasing fiber and increasing water consumption.  He feels that he adequately consumes water.  He tries to stay  active.  He is reluctant to take any medications but did try Colace once which was not helpful.  ROS: Per HPI  Allergies  Allergen Reactions  . Pineapple Nausea And Vomiting  . Tape Itching and Other (See Comments)    *Paper tape to be used only- please*   Past Medical History:  Diagnosis Date  . Cancer ([place]) 01/29/2019   lung cancer dx february 2020  . COPD (chronic obstructive pulmonary disease) ([place])   . Gastrointestinal bleeding   . GERD (gastroesophageal reflux disease)   . Glaucoma    left eyes surgical correction 2013    Current Outpatient Medications:  .  aspirin EC 81 MG EC tablet, Take 1 tablet (81 mg total) by mouth daily., Disp:  , Rfl:  .  Cyanocobalamin (VITAMIN B-12 PO), Take 1 tablet by mouth daily., Disp: , Rfl:  .  pantoprazole (PROTONIX) 40 MG tablet, Take 1 tablet (40 mg total) by mouth daily before breakfast., Disp: 30 tablet, Rfl: 5 .  Pediatric Multivitamins-Iron (FLINTSTONES PLUS IRON) chewable tablet, Chew 1 tablet by mouth 2 (two) times daily., Disp: , Rfl:  Social History   Socioeconomic History  . Marital status: Married    Spouse name: Not on file  . Number of children: Not on file  . Years of education: Not on file  . Highest education level: Not on file  Occupational History  . Not on file  Social Needs  . Financial resource strain: Not on file  .  Food insecurity    Worry: Never true    Inability: Never true  . Transportation needs    Medical: No    Non-medical: No  Tobacco Use  . Smoking status: Current Every Day Smoker    Packs/day: 1.50    Years: 45.00    Pack years: 67.50    Types: Cigarettes  . Smokeless tobacco: Never Used  Substance and Sexual Activity  . Alcohol use: Yes    Alcohol/week: 14.0 standard drinks    Types: 14 Cans of beer per week    Comment: about 2 cans of beer a day  . Drug use: No  . Sexual activity: Yes    Birth control/protection: None  Lifestyle  . Physical activity    Days per week: 7 days     Minutes per session: 20 min  . Stress: Not at all  Relationships  . Social Herbalist on phone: Twice a week    Gets together: Three times a week    Attends religious service: Never    Active member of club or organization: No    Attends meetings of clubs or organizations: Never    Relationship status: Married  . Intimate partner violence    Fear of current or ex partner: Not on file    Emotionally abused: Not on file    Physically abused: Not on file    Forced sexual activity: Not on file  Other Topics Concern  . Not on file  Social History Narrative   Married with 3 children, all girls, 4 grandchildren, 3 great grandchildren   Family History  Problem Relation Age of Onset  . Tuberculosis Father   . Cancer Brother        lung  . Heart attack Brother 67    Objective: Office vital signs reviewed. BP (!) 165/70   Pulse 85   Temp 98.7 F (37.1 C) (Oral)   Ht 6' (1.829 m)   Wt 149 lb (67.6 kg)   SpO2 97%   BMI 20.21 kg/m   Physical Examination:  General: Awake, alert, nontoxic.  Wearing eye patch, No acute distress HEENT: Normal.  Sclera white.  PERRLA, EOMI                Cardio: regular rate and rhythm, S1S2 heard, no murmurs appreciated Pulm: No wheezes, rhonchi or rales.  Normal work of breathing on room air Extremities: warm, well perfused, No edema, cyanosis or clubbing; +2 pulses bilaterally MSK: Normal gait and station Neuro: Cranial nerves II through XII appear to be grossly intact.  Assessment/ Plan: 73 y.o. male   1. Oculomotor nerve palsy, unspecified laterality His symptoms seem to be improving as he had no problem tracking during today's exam.  I have gone ahead and placed him on amlodipine.  We discussed starting low at 2.5 mg daily and then increasing the couple of weeks of blood pressure was still above 140/90.  His family and the patient seemed very amenable to this plan.  His daughters will monitor his blood pressures closely at home and  we discussed how to take blood pressures appropriately.  For now they continue to wish to hold off on statin.  Patient is not ready to stop smoking and has no desire to treat cancer. - amLODipine (NORVASC) 5 MG tablet; Take 0.5 tablets (2.5 mg total) by mouth daily for 14 days, THEN 1 tablet (5 mg total) daily.  Dispense: 90 tablet; Refill: 3 - Ambulatory referral to  Neurology  2. Hospital discharge follow-up I reviewed his discharge summary and recommendations. - Ambulatory referral to Neurology  3. Uncontrolled hypertension Start Norvasc as above - amLODipine (NORVASC) 5 MG tablet; Take 0.5 tablets (2.5 mg total) by mouth daily for 14 days, THEN 1 tablet (5 mg total) daily.  Dispense: 90 tablet; Refill: 3 - Basic Metabolic Panel  4. Anemia, unspecified type Hemoglobin noted to be in the 12's.  Check CBC - CBC  5. Chronic idiopathic constipation We discussed how to treat constipation.  Handout was provided.  All questions answered.  He will follow-up as needed   No orders of the defined types were placed in this encounter.  No orders of the defined types were placed in this encounter.    Janora Norlander, DO [place] [phone]

## 2019-02-23 ENCOUNTER — Telehealth: Payer: Self-pay | Admitting: Family Medicine

## 2019-02-23 ENCOUNTER — Other Ambulatory Visit: Payer: Self-pay

## 2019-02-23 NOTE — Telephone Encounter (Signed)
Patient's wife informed that visit needs to be in person so he may do lab work.

## 2019-02-23 NOTE — Telephone Encounter (Signed)
Spoke with pt and he states he has been taking his BP at home and can take his BP tomorrow prior to visit. Is it ok to do over the telephone or does he ntbs in office with you?

## 2019-02-23 NOTE — Telephone Encounter (Signed)
He needs labs repeated.

## 2019-02-24 ENCOUNTER — Encounter: Payer: Self-pay | Admitting: Family Medicine

## 2019-02-24 ENCOUNTER — Ambulatory Visit (INDEPENDENT_AMBULATORY_CARE_PROVIDER_SITE_OTHER): Payer: Medicare Other | Admitting: Family Medicine

## 2019-02-24 DIAGNOSIS — J418 Mixed simple and mucopurulent chronic bronchitis: Secondary | ICD-10-CM | POA: Diagnosis not present

## 2019-02-24 DIAGNOSIS — D649 Anemia, unspecified: Secondary | ICD-10-CM | POA: Diagnosis not present

## 2019-02-24 DIAGNOSIS — H4901 Third [oculomotor] nerve palsy, right eye: Secondary | ICD-10-CM

## 2019-02-24 DIAGNOSIS — I10 Essential (primary) hypertension: Secondary | ICD-10-CM | POA: Diagnosis not present

## 2019-02-24 NOTE — Progress Notes (Signed)
Virtual Visit via telephone Note Due to COVID-19 pandemic this visit was conducted virtually. This visit type was conducted due to national recommendations for restrictions regarding the COVID-19 Pandemic (e.g. social distancing, sheltering in place) in an effort to limit this patient's exposure and mitigate transmission in our community. All issues noted in this document were discussed and addressed.  A physical exam was not performed with this format.   I connected with KEO SCHIRMER on 02/24/2019 at 1000 by telephone and verified that I am speaking with the correct person using two identifiers. VUK SKILLERN is currently located at home and family is currently with them during visit. The provider, Monia Pouch, FNP is located in their office at time of visit.  I discussed the limitations, risks, security and privacy concerns of performing an evaluation and management service by telephone and the availability of in person appointments. I also discussed with the patient that there may be a patient responsible charge related to this service. The patient expressed understanding and agreed to proceed.  Subjective:  Patient ID: Thomas Davenport, male    DOB: [birth year], 73 y.o.   MRN: [id number]  Chief Complaint:  Medical Management of Chronic Issues, Hypertension, COPD, and Anemia   HPI: Thomas Davenport is a 73 y.o. male presenting on 02/24/2019 for Medical Management of Chronic Issues, Hypertension, COPD, and Anemia  1. Essential hypertension Pt reports good control of BP at home, 135-140/80-85. States he has been limiting his sodium intake and eating more vegetables. Pt states he is compliant with his medications and does not have an leg swelling or other associated side effects. No chest pain, shortness of breath, headaches, dizziness, or confusion.   2. Right oculomotor nerve palsy Pt reports complete resolution of symptoms. States his vision has returned to normal and he does not have any eye  or facial weakness.   3. Mixed simple and mucopurulent chronic bronchitis ([place]) Does have a cough at times, denies sputum production. Is still smoking. Does not take anything for his COPD and does not wish to start.   4. Anemia, unspecified type GI bleed several months ago leading to anemia. Has been on iron repletion therapy and is compliant with therapy. Denies hematochezia, melena, weakness, dizziness, palpitations, or syncope. No shortness of breath or pallor.    Relevant past medical, surgical, family, and social history reviewed and updated as indicated.  Allergies and medications reviewed and updated.   Past Medical History:  Diagnosis Date   Cancer ([place]) 01/29/2019   lung cancer dx february 2020   COPD (chronic obstructive pulmonary disease) ([facility])    Gastrointestinal bleeding    GERD (gastroesophageal reflux disease)    Glaucoma    left eyes surgical correction 2013    Past Surgical History:  Procedure Laterality Date   CATARACT EXTRACTION W/PHACO  08/31/2011   Procedure: CATARACT EXTRACTION PHACO AND INTRAOCULAR LENS PLACEMENT ([place]);  Surgeon: Tonny Branch, MD;  Location: [facility];  Service: Ophthalmology;  Laterality: Left;  CDE:22.56   CATARACT EXTRACTION W/PHACO  09/14/2011   Procedure: CATARACT EXTRACTION PHACO AND INTRAOCULAR LENS PLACEMENT ([facility]);  Surgeon: Tonny Branch, MD;  Location: [facility];  Service: Ophthalmology;  Laterality: Right;  CDE:24.60    COLONOSCOPY N/A 07/29/2017   Procedure: COLONOSCOPY;  Surgeon: Rogene Houston, MD;  Location: [facility];  Service: Endoscopy;  Laterality: N/A;   ESOPHAGOGASTRODUODENOSCOPY N/A 05/11/2017   Procedure: ESOPHAGOGASTRODUODENOSCOPY ([facility]);  Surgeon: Rogene Houston, MD;  Location: [facility];  Service: Endoscopy;  Laterality: N/A;   ESOPHAGOGASTRODUODENOSCOPY N/A 07/29/2017   Procedure: ESOPHAGOGASTRODUODENOSCOPY ([facility]);  Surgeon: Rogene Houston, MD;  Location: [facility];  Service: Endoscopy;  Laterality: N/A;   1000    Social History   Socioeconomic History   Marital status: Married    Spouse name: Not on file   Number of children: Not on file   Years of education: Not on file   Highest education level: Not on file  Occupational History   Not on file  Social Needs   Financial resource strain: Not on file   Food insecurity    Worry: Never true    Inability: Never true   Transportation needs    Medical: No    Non-medical: No  Tobacco Use   Smoking status: Current Every Day Smoker    Packs/day: 1.50    Years: 45.00    Pack years: 67.50    Types: Cigarettes   Smokeless tobacco: Never Used  Substance and Sexual Activity   Alcohol use: Yes    Alcohol/week: 14.0 standard drinks    Types: 14 Cans of beer per week    Comment: about 2 cans of beer a day   Drug use: No   Sexual activity: Yes    Birth control/protection: None  Lifestyle   Physical activity    Days per week: 7 days    Minutes per session: 20 min   Stress: Not at all  Relationships   Social connections    Talks on phone: Twice a week    Gets together: Three times a week    Attends religious service: Never    Active member of club or organization: No    Attends meetings of clubs or organizations: Never    Relationship status: Married   Intimate partner violence    Fear of current or ex partner: Not on file    Emotionally abused: Not on file    Physically abused: Not on file    Forced sexual activity: Not on file  Other Topics Concern   Not on file  Social History Narrative   Married with 3 children, all girls, 4 grandchildren, 3 great grandchildren    Outpatient Encounter Medications as of 02/24/2019  Medication Sig   amLODipine (NORVASC) 5 MG tablet Take 0.5 tablets (2.5 mg total) by mouth daily for 14 days, THEN 1 tablet (5 mg total) daily.   aspirin EC 81 MG EC tablet Take 1 tablet (81 mg total) by mouth daily.   Cyanocobalamin (VITAMIN B-12 PO) Take 1 tablet by mouth daily.    pantoprazole (PROTONIX) 40 MG tablet Take 1 tablet (40 mg total) by mouth daily before breakfast.   Pediatric Multivitamins-Iron (FLINTSTONES PLUS IRON) chewable tablet Chew 1 tablet by mouth 2 (two) times daily.   No facility-administered encounter medications on file as of 02/24/2019.     Allergies  Allergen Reactions   Pineapple Nausea And Vomiting   Tape Itching and Other (See Comments)    *Paper tape to be used only- please*    Review of Systems  Constitutional: Negative for activity change, appetite change, chills, diaphoresis, fatigue, fever and unexpected weight change.  HENT: Negative.   Eyes: Negative.  Negative for photophobia, pain, discharge, redness, itching and visual disturbance.  Respiratory: Negative for cough, chest tightness and shortness of breath.   Cardiovascular: Negative for chest pain, palpitations and leg swelling.  Gastrointestinal: Negative for abdominal pain, anal bleeding, blood in stool, constipation, diarrhea, nausea and vomiting.  Endocrine: Negative.  Negative for cold intolerance, heat intolerance and polydipsia.  Genitourinary: Negative for decreased urine volume, difficulty urinating, dysuria, frequency, hematuria and urgency.  Musculoskeletal: Negative for arthralgias and myalgias.  Skin: Negative.  Negative for color change.  Allergic/Immunologic: Negative.   Neurological: Negative for dizziness, tremors, seizures, syncope, facial asymmetry, speech difficulty, weakness, light-headedness, numbness and headaches.  Hematological: Negative.   Psychiatric/Behavioral: Negative for confusion, hallucinations, sleep disturbance and suicidal ideas.  All other systems reviewed and are negative.        Observations/Objective: No vital signs or physical exam, this was a telephone or virtual health encounter.  Pt alert and oriented, answers all questions appropriately, and able to speak in full sentences.    Assessment and Plan: [place] was seen today  for hypertension.  Diagnoses and all orders for this visit:  Essential hypertension Reports good home BP readings. Does need repeat BMP since initiating amlodipine. Pt will come to office when able to have drawn.  -     BMP8+EGFR; Future  Right oculomotor nerve palsy Reports resolution of symptoms.   Mixed simple and mucopurulent chronic bronchitis ([place]) Does not take anything for his COPD and does not wish to start.   Anemia, unspecified type Previous GI bleed with low Hgb and Hct. Will recheck labs. Pt denies recurrence of symptoms and does take iron repletion therapy.  -     CBC with Differential/Platelet; Future     Follow Up Instructions: Return in about 6 weeks (around 04/07/2019), or if symptoms worsen or fail to improve, for HTN.    I discussed the assessment and treatment plan with the patient. The patient was provided an opportunity to ask questions and all were answered. The patient agreed with the plan and demonstrated an understanding of the instructions.   The patient was advised to call back or seek an in-person evaluation if the symptoms worsen or if the condition fails to improve as anticipated.  The above assessment and management plan was discussed with the patient. The patient verbalized understanding of and has agreed to the management plan. Patient is aware to call the clinic if they develop any new symptoms or if symptoms persist or worsen. Patient is aware when to return to the clinic for a follow-up visit. Patient educated on when it is appropriate to go to the emergency department.    I provided 25 minutes of non-face-to-face time during this encounter. The call started at 1000. The call ended at 1025. The other time was used for coordination of care.    Monia Pouch, FNP-C [facility] [address] [place], [place] [address] [phone] 02/24/2019

## 2019-03-14 ENCOUNTER — Ambulatory Visit: Payer: Medicare Other | Admitting: Neurology

## 2019-04-04 ENCOUNTER — Other Ambulatory Visit: Payer: Self-pay

## 2019-04-05 ENCOUNTER — Ambulatory Visit (INDEPENDENT_AMBULATORY_CARE_PROVIDER_SITE_OTHER): Payer: Medicare Other | Admitting: Family Medicine

## 2019-04-05 ENCOUNTER — Encounter: Payer: Self-pay | Admitting: Family Medicine

## 2019-04-05 VITALS — BP 171/73 | HR 90 | Temp 98.9°F | Resp 20 | Ht 72.0 in | Wt 144.0 lb

## 2019-04-05 DIAGNOSIS — Z9119 Patient's noncompliance with other medical treatment and regimen: Secondary | ICD-10-CM | POA: Diagnosis not present

## 2019-04-05 DIAGNOSIS — F1010 Alcohol abuse, uncomplicated: Secondary | ICD-10-CM

## 2019-04-05 DIAGNOSIS — I10 Essential (primary) hypertension: Secondary | ICD-10-CM | POA: Diagnosis not present

## 2019-04-05 DIAGNOSIS — J418 Mixed simple and mucopurulent chronic bronchitis: Secondary | ICD-10-CM

## 2019-04-05 DIAGNOSIS — Z91199 Patient's noncompliance with other medical treatment and regimen due to unspecified reason: Secondary | ICD-10-CM

## 2019-04-05 NOTE — Patient Instructions (Signed)
DASH Eating Plan DASH stands for "Dietary Approaches to Stop Hypertension." The DASH eating plan is a healthy eating plan that has been shown to reduce high blood pressure (hypertension). Additional health benefits may include reducing the risk of type 2 diabetes mellitus, heart disease, and stroke. The DASH eating plan may also help with weight loss.  WHAT DO I NEED TO KNOW ABOUT THE DASH EATING PLAN? For the DASH eating plan, you will follow these general guidelines:  Choose foods with a percent daily value for sodium of less than 5% (as listed on the food label).  Use salt-free seasonings or herbs instead of table salt or sea salt.  Check with your health care provider or pharmacist before using salt substitutes.  Eat lower-sodium products, often labeled as "lower sodium" or "no salt added."  Eat fresh foods.  Eat more vegetables, fruits, and low-fat dairy products.  Choose whole grains. Look for the word "whole" as the first word in the ingredient list.  Choose fish and skinless chicken or turkey more often than red meat. Limit fish, poultry, and meat to 6 oz (170 g) each day.  Limit sweets, desserts, sugars, and sugary drinks.  Choose heart-healthy fats.  Limit cheese to 1 oz (28 g) per day.  Eat more home-cooked food and less restaurant, buffet, and fast food.  Limit fried foods.  Cook foods using methods other than frying.  Limit canned vegetables. If you do use them, rinse them well to decrease the sodium.  When eating at a restaurant, ask that your food be prepared with less salt, or no salt if possible.  WHAT FOODS CAN I EAT? Seek help from a dietitian for individual calorie needs.  Grains Whole grain or whole wheat bread. Brown rice. Whole grain or whole wheat pasta. Quinoa, bulgur, and whole grain cereals. Low-sodium cereals. Corn or whole wheat flour tortillas. Whole grain cornbread. Whole grain crackers. Low-sodium crackers.  Vegetables Fresh or frozen  vegetables (raw, steamed, roasted, or grilled). Low-sodium or reduced-sodium tomato and vegetable juices. Low-sodium or reduced-sodium tomato sauce and paste. Low-sodium or reduced-sodium canned vegetables.   Fruits All fresh, canned (in natural juice), or frozen fruits.  Meat and Other Protein Products Ground beef (85% or leaner), grass-fed beef, or beef trimmed of fat. Skinless chicken or turkey. Ground chicken or turkey. Pork trimmed of fat. All fish and seafood. Eggs. Dried beans, peas, or lentils. Unsalted nuts and seeds. Unsalted canned beans.  Dairy Low-fat dairy products, such as skim or 1% milk, 2% or reduced-fat cheeses, low-fat ricotta or cottage cheese, or plain low-fat yogurt. Low-sodium or reduced-sodium cheeses.  Fats and Oils Tub margarines without trans fats. Light or reduced-fat mayonnaise and salad dressings (reduced sodium). Avocado. Safflower, olive, or canola oils. Natural peanut or almond butter.  Other Unsalted popcorn and pretzels. The items listed above may not be a complete list of recommended foods or beverages. Contact your dietitian for more options.  WHAT FOODS ARE NOT RECOMMENDED?  Grains White bread. White pasta. White rice. Refined cornbread. Bagels and croissants. Crackers that contain trans fat.  Vegetables Creamed or fried vegetables. Vegetables in a cheese sauce. Regular canned vegetables. Regular canned tomato sauce and paste. Regular tomato and vegetable juices.  Fruits Dried fruits. Canned fruit in light or heavy syrup. Fruit juice.  Meat and Other Protein Products Fatty cuts of meat. Ribs, chicken wings, bacon, sausage, bologna, salami, chitterlings, fatback, hot dogs, bratwurst, and packaged luncheon meats. Salted nuts and seeds. Canned beans with salt.    Dairy Whole or 2% milk, cream, half-and-half, and cream cheese. Whole-fat or sweetened yogurt. Full-fat cheeses or blue cheese. Nondairy creamers and whipped toppings. Processed cheese,  cheese spreads, or cheese curds.  Condiments Onion and garlic salt, seasoned salt, table salt, and sea salt. Canned and packaged gravies. Worcestershire sauce. Tartar sauce. Barbecue sauce. Teriyaki sauce. Soy sauce, including reduced sodium. Steak sauce. Fish sauce. Oyster sauce. Cocktail sauce. Horseradish. Ketchup and mustard. Meat flavorings and tenderizers. Bouillon cubes. Hot sauce. Tabasco sauce. Marinades. Taco seasonings. Relishes.  Fats and Oils Butter, stick margarine, lard, shortening, ghee, and bacon fat. Coconut, palm kernel, or palm oils. Regular salad dressings.  Other Pickles and olives. Salted popcorn and pretzels.  The items listed above may not be a complete list of foods and beverages to avoid. Contact your dietitian for more information.  WHERE CAN I FIND MORE INFORMATION? National Heart, Lung, and Blood Institute: www.nhlbi.nih.gov/health/health-topics/topics/dash/ Document Released: 02/26/2011 Document Revised: 07/24/2013 Document Reviewed: 01/11/2013 ExitCare Patient Information 2015 [employer]. This information is not intended to replace advice given to you by your health care provider. Make sure you discuss any questions you have with your health care provider.   I think that you would greatly benefit from seeing a nutritionist.  If you are interested, please call Dr Sykes at [phone] to schedule an appointment.   

## 2019-04-05 NOTE — Progress Notes (Signed)
Subjective:  Patient ID: Thomas Davenport, male    DOB: 01/31/1946, 74 y.o.   MRN: [id number]  Patient Care Team: Baruch Gouty, FNP as PCP - General (Family Medicine) Rogene Houston, MD as Consulting Physician (Gastroenterology)   Chief Complaint:  Medical Management of Chronic Issues and Hypertension   HPI: Thomas Davenport is a 74 y.o. male presenting on 04/05/2019 for Medical Management of Chronic Issues and Hypertension   1. Uncontrolled hypertension Pt states he has been taking Norvasc 2.5 mg in the morning and 2.5 mg in the evening. He is not checking his blood pressure at home. He continues to drink at least 4 beers per day. He denies visual disturbances. States he feels "fine". No headache, chest pain, shortness of breath, dizziness, or syncope. Was seen by Dr. Lajuana Ripple on 02/06/2019 after discharge from the ED for oculomotor nerve palsy and hypertension. Pt and family declined initiating ASA and statin therapy when in the ED. Pt and family agreed to Amlodipine and has reportedly been taking as prescribed. Does not follow a diet or exercise routine.   2. Mixed simple and mucopurulent chronic bronchitis ([place]) He reports he has never been on any medications for his bronchitis and does not wish to start. He is still smoking and does not wish to quit. He reports a dry cough. No shortness of breath.   3. Alcohol abuse Continues to drink daily. No desire to quit.      Relevant past medical, surgical, family, and social history reviewed and updated as indicated.  Allergies and medications reviewed and updated. Date reviewed: Chart in Epic.   Past Medical History:  Diagnosis Date  . Cancer ([place]) 01/29/2019   lung cancer dx february 2020  . COPD (chronic obstructive pulmonary disease) ([place])   . Gastrointestinal bleeding   . GERD (gastroesophageal reflux disease)   . Glaucoma    left eyes surgical correction 2013    Past Surgical History:  Procedure Laterality Date  .  CATARACT EXTRACTION W/PHACO  08/31/2011   Procedure: CATARACT EXTRACTION PHACO AND INTRAOCULAR LENS PLACEMENT (IOC);  Surgeon: Tonny Branch, MD;  Location: [facility];  Service: Ophthalmology;  Laterality: Left;  CDE:22.56  . CATARACT EXTRACTION W/PHACO  09/14/2011   Procedure: CATARACT EXTRACTION PHACO AND INTRAOCULAR LENS PLACEMENT (IOC);  Surgeon: Tonny Branch, MD;  Location: [facility];  Service: Ophthalmology;  Laterality: Right;  CDE:24.60   . COLONOSCOPY N/A 07/29/2017   Procedure: COLONOSCOPY;  Surgeon: Rogene Houston, MD;  Location: [facility];  Service: Endoscopy;  Laterality: N/A;  . ESOPHAGOGASTRODUODENOSCOPY N/A 05/11/2017   Procedure: ESOPHAGOGASTRODUODENOSCOPY (EGD);  Surgeon: Rogene Houston, MD;  Location: [facility];  Service: Endoscopy;  Laterality: N/A;  . ESOPHAGOGASTRODUODENOSCOPY N/A 07/29/2017   Procedure: ESOPHAGOGASTRODUODENOSCOPY (EGD);  Surgeon: Rogene Houston, MD;  Location: [facility];  Service: Endoscopy;  Laterality: N/A;  1000    Social History   Socioeconomic History  . Marital status: Married    Spouse name: Not on file  . Number of children: Not on file  . Years of education: Not on file  . Highest education level: Not on file  Occupational History  . Not on file  Tobacco Use  . Smoking status: Current Every Day Smoker    Packs/day: 1.50    Years: 45.00    Pack years: 67.50    Types: Cigarettes  . Smokeless tobacco: Never Used  Substance and Sexual Activity  . Alcohol use: Yes  Alcohol/week: 14.0 standard drinks    Types: 14 Cans of beer per week    Comment: about 2 cans of beer a day  . Drug use: No  . Sexual activity: Yes    Birth control/protection: None  Other Topics Concern  . Not on file  Social History Narrative   Married with 3 children, all girls, 4 grandchildren, 3 great grandchildren   Social Determinants of Health   Financial Resource Strain:   . Difficulty of Paying Living Expenses: Not on file  Food Insecurity:   . Worried  About Charity fundraiser in the Last Year: Not on file  . Ran Out of Food in the Last Year: Not on file  Transportation Needs:   . Lack of Transportation (Medical): Not on file  . Lack of Transportation (Non-Medical): Not on file  Physical Activity:   . Days of Exercise per Week: Not on file  . Minutes of Exercise per Session: Not on file  Stress:   . Feeling of Stress : Not on file  Social Connections:   . Frequency of Communication with Friends and Family: Not on file  . Frequency of Social Gatherings with Friends and Family: Not on file  . Attends Religious Services: Not on file  . Active Member of Clubs or Organizations: Not on file  . Attends Archivist Meetings: Not on file  . Marital Status: Not on file  Intimate Partner Violence:   . Fear of Current or Ex-Partner: Not on file  . Emotionally Abused: Not on file  . Physically Abused: Not on file  . Sexually Abused: Not on file    Outpatient Encounter Medications as of 04/05/2019  Medication Sig  . amLODipine (NORVASC) 5 MG tablet Take 0.5 tablets (2.5 mg total) by mouth daily for 14 days, THEN 1 tablet (5 mg total) daily.  . Cyanocobalamin (VITAMIN B-12 PO) Take 1 tablet by mouth daily.  . Pediatric Multivitamins-Iron (FLINTSTONES PLUS IRON) chewable tablet Chew 1 tablet by mouth 2 (two) times daily.  . [DISCONTINUED] aspirin EC 81 MG EC tablet Take 1 tablet (81 mg total) by mouth daily.  . [DISCONTINUED] pantoprazole (PROTONIX) 40 MG tablet Take 1 tablet (40 mg total) by mouth daily before breakfast.   No facility-administered encounter medications on file as of 04/05/2019.    Allergies  Allergen Reactions  . Pineapple Nausea And Vomiting  . Tape Itching and Other (See Comments)    *Paper tape to be used only- please*    Review of Systems  Constitutional: Negative for activity change, appetite change, chills, diaphoresis, fatigue, fever and unexpected weight change.  HENT: Negative.   Eyes: Negative.   Negative for photophobia and visual disturbance.  Respiratory: Negative for cough, chest tightness and shortness of breath.   Cardiovascular: Negative for chest pain, palpitations and leg swelling.  Gastrointestinal: Negative for abdominal pain, anal bleeding, blood in stool, constipation, diarrhea, nausea and vomiting.  Endocrine: Negative.  Negative for polydipsia, polyphagia and polyuria.  Genitourinary: Negative for decreased urine volume, difficulty urinating, dysuria, frequency and urgency.  Musculoskeletal: Negative for arthralgias and myalgias.  Skin: Negative.  Negative for rash.  Allergic/Immunologic: Negative.   Neurological: Negative for dizziness, tremors, seizures, syncope, facial asymmetry, speech difficulty, weakness, light-headedness, numbness and headaches.  Hematological: Negative.  Does not bruise/bleed easily.  Psychiatric/Behavioral: Negative for confusion, hallucinations, sleep disturbance and suicidal ideas.  All other systems reviewed and are negative.       Objective:  BP (!) 171/73 (BP  Location: Right Arm, Cuff Size: Normal)   Pulse 90   Temp 98.9 F (37.2 C)   Resp 20   Ht 6' (1.829 m)   Wt 144 lb (65.3 kg)   SpO2 98%   BMI 19.53 kg/m    Wt Readings from Last 3 Encounters:  04/05/19 144 lb (65.3 kg)  02/06/19 149 lb (67.6 kg)  01/29/19 141 lb 5 oz (64.1 kg)    Physical Exam Vitals and nursing note reviewed.  Constitutional:      General: He is not in acute distress.    Appearance: Normal appearance. He is well-developed, well-groomed and normal weight. He is not ill-appearing, toxic-appearing or diaphoretic.  HENT:     Head: Normocephalic and atraumatic.     Jaw: There is normal jaw occlusion.     Right Ear: Hearing, tympanic membrane, ear canal and external ear normal.     Left Ear: Hearing, tympanic membrane, ear canal and external ear normal.     Nose: No congestion or rhinorrhea.     Right Sinus: No maxillary sinus tenderness or frontal  sinus tenderness.     Left Sinus: No maxillary sinus tenderness or frontal sinus tenderness.     Comments: Nasal telangiectasia    Mouth/Throat:     Lips: Pink.     Mouth: Mucous membranes are moist.     Pharynx: Oropharynx is clear. Uvula midline.  Eyes:     General: Lids are normal. Vision grossly intact.     Extraocular Movements: Extraocular movements intact.     Conjunctiva/sclera: Conjunctivae normal.     Pupils: Pupils are equal, round, and reactive to light.  Neck:     Thyroid: No thyroid mass, thyromegaly or thyroid tenderness.     Vascular: No carotid bruit or JVD.     Trachea: Trachea and phonation normal.  Cardiovascular:     Rate and Rhythm: Normal rate and regular rhythm.     Chest Wall: PMI is not displaced.     Pulses: Normal pulses.     Heart sounds: Normal heart sounds. No murmur. No friction rub. No gallop.   Pulmonary:     Effort: Pulmonary effort is normal. No respiratory distress.     Breath sounds: Normal breath sounds. No wheezing.  Abdominal:     General: Bowel sounds are normal. There is no distension or abdominal bruit.     Palpations: Abdomen is soft. There is no hepatomegaly or splenomegaly.     Tenderness: There is no abdominal tenderness. There is no right CVA tenderness or left CVA tenderness.     Hernia: No hernia is present.  Musculoskeletal:        General: Normal range of motion.     Cervical back: Normal range of motion and neck supple.     Right lower leg: No edema.     Left lower leg: No edema.  Lymphadenopathy:     Cervical: No cervical adenopathy.  Skin:    General: Skin is warm and dry.     Capillary Refill: Capillary refill takes less than 2 seconds.     Coloration: Skin is not cyanotic, jaundiced or pale.     Findings: No rash.  Neurological:     General: No focal deficit present.     Mental Status: He is alert and oriented to person, place, and time.     Cranial Nerves: Cranial nerves are intact. No cranial nerve deficit.      Sensory: Sensation is intact. No sensory deficit.  Motor: Motor function is intact. No weakness.     Coordination: Coordination is intact. Coordination normal.     Gait: Gait is intact. Gait normal.     Deep Tendon Reflexes: Reflexes are normal and symmetric. Reflexes normal.  Psychiatric:        Attention and Perception: Attention and perception normal.        Mood and Affect: Mood and affect normal.        Speech: Speech normal.        Behavior: Behavior normal. Behavior is cooperative.        Thought Content: Thought content normal.        Cognition and Memory: Cognition and memory normal.        Judgment: Judgment normal.     Results for orders placed or performed during the hospital encounter of 01/29/19  SARS CORONAVIRUS 2 (TAT 6-24 HRS) Nasopharyngeal Nasopharyngeal Swab   Specimen: Nasopharyngeal Swab  Result Value Ref Range   SARS Coronavirus 2 NEGATIVE NEGATIVE  Ethanol  Result Value Ref Range   Alcohol, Ethyl (B) <10 <10 mg/dL  Protime-INR  Result Value Ref Range   Prothrombin Time 12.6 11.4 - 15.2 seconds   INR 1.0 0.8 - 1.2  APTT  Result Value Ref Range   aPTT 25 24 - 36 seconds  CBC  Result Value Ref Range   WBC 4.2 4.0 - 10.5 K/uL   RBC 3.27 (L) 4.22 - 5.81 MIL/uL   Hemoglobin 12.1 (L) 13.0 - 17.0 g/dL   HCT 35.9 (L) 39.0 - 52.0 %   MCV 109.8 (H) 80.0 - 100.0 fL   MCH 37.0 (H) 26.0 - 34.0 pg   MCHC 33.7 30.0 - 36.0 g/dL   RDW 14.5 11.5 - 15.5 %   Platelets 199 150 - 400 K/uL   nRBC 0.0 0.0 - 0.2 %  Differential  Result Value Ref Range   Neutrophils Relative % 66 %   Neutro Abs 2.8 1.7 - 7.7 K/uL   Lymphocytes Relative 16 %   Lymphs Abs 0.7 0.7 - 4.0 K/uL   Monocytes Relative 13 %   Monocytes Absolute 0.5 0.1 - 1.0 K/uL   Eosinophils Relative 1 %   Eosinophils Absolute 0.1 0.0 - 0.5 K/uL   Basophils Relative 2 %   Basophils Absolute 0.1 0.0 - 0.1 K/uL   Immature Granulocytes 2 %   Abs Immature Granulocytes 0.09 (H) 0.00 - 0.07 K/uL    Comprehensive metabolic panel  Result Value Ref Range   Sodium 135 135 - 145 mmol/L   Potassium 3.6 3.5 - 5.1 mmol/L   Chloride 100 98 - 111 mmol/L   CO2 27 22 - 32 mmol/L   Glucose, Bld 99 70 - 99 mg/dL   BUN 11 8 - 23 mg/dL   Creatinine, Ser 1.10 0.61 - 1.24 mg/dL   Calcium 8.9 8.9 - 10.3 mg/dL   Total Protein 6.7 6.5 - 8.1 g/dL   Albumin 3.7 3.5 - 5.0 g/dL   AST 15 15 - 41 U/L   ALT 8 0 - 44 U/L   Alkaline Phosphatase 71 38 - 126 U/L   Total Bilirubin 0.9 0.3 - 1.2 mg/dL   GFR calc non Af Amer >60 >60 mL/min   GFR calc Af Amer >60 >60 mL/min   Anion gap 8 5 - 15  Urine rapid drug screen (hosp performed)  Result Value Ref Range   Opiates NONE DETECTED NONE DETECTED   Cocaine NONE DETECTED NONE DETECTED  Benzodiazepines NONE DETECTED NONE DETECTED   Amphetamines NONE DETECTED NONE DETECTED   Tetrahydrocannabinol NONE DETECTED NONE DETECTED   Barbiturates NONE DETECTED NONE DETECTED  Urinalysis, Routine w reflex microscopic  Result Value Ref Range   Color, Urine STRAW (A) YELLOW   APPearance CLEAR CLEAR   Specific Gravity, Urine 1.003 (L) 1.005 - 1.030   pH 7.0 5.0 - 8.0   Glucose, UA NEGATIVE NEGATIVE mg/dL   Hgb urine dipstick NEGATIVE NEGATIVE   Bilirubin Urine NEGATIVE NEGATIVE   Ketones, ur NEGATIVE NEGATIVE mg/dL   Protein, ur NEGATIVE NEGATIVE mg/dL   Nitrite NEGATIVE NEGATIVE   Leukocytes,Ua NEGATIVE NEGATIVE  Vitamin B12  Result Value Ref Range   Vitamin B-12 483 180 - 914 pg/mL  TSH  Result Value Ref Range   TSH 3.406 0.350 - 4.500 uIU/mL  Lipid panel  Result Value Ref Range   Cholesterol 177 0 - 200 mg/dL   Triglycerides 106 <150 mg/dL   HDL 52 >40 mg/dL   Total CHOL/HDL Ratio 3.4 RATIO   VLDL 21 0 - 40 mg/dL   LDL Cholesterol 104 (H) 0 - 99 mg/dL  Hemoglobin A1c  Result Value Ref Range   Hgb A1c MFr Bld 5.0 4.8 - 5.6 %   Mean Plasma Glucose 96.8 mg/dL       Pertinent labs & imaging results that were available during my care of the patient  were reviewed by me and considered in my medical decision making.  Assessment & Plan:  Moyses was seen today for medical management of chronic issues and hypertension.  Diagnoses and all orders for this visit:  Uncontrolled hypertension BP remains uncontrolled. Pt refuses to increase amlodipine to 10 mg daily. Declines initiation of additional medications. Reiterated the importance of blood pressure control to prevent end organ damage or acute cardiovascular events. Pt declines additional therapy. Will check labs today.  -     CBC with Differential/Platelet -     CMP14+EGFR -     Lipid panel -     Thyroid Panel With TSH -     Microalbumin / creatinine urine ratio  Mixed simple and mucopurulent chronic bronchitis ([facility]) Declines medications. Reports symptoms are well controlled.  -     CBC with Differential/Platelet  Alcohol abuse Drinks daily and does not wish to stop. Declined referral today.  -     CBC with Differential/Platelet -     CMP14+EGFR  Noncompliance Long discussion about importance of medication compliance along with lifestyle changes to prevent acute or long term damage form underlying health conditions, especially hypertension. Pt refused new medications today. Was willing to have labs drawn.     Continue all other maintenance medications.  Follow up plan: Return in about 3 months (around 07/04/2019), or if symptoms worsen or fail to improve, for HTN.  Continue healthy lifestyle choices, including diet (rich in fruits, vegetables, and lean proteins, and low in salt and simple carbohydrates) and exercise (at least 30 minutes of moderate physical activity daily).  Educational handout given for DASH  The above assessment and management plan was discussed with the patient. The patient verbalized understanding of and has agreed to the management plan. Patient is aware to call the clinic if they develop any new symptoms or if symptoms persist or worsen. Patient is aware  when to return to the clinic for a follow-up visit. Patient educated on when it is appropriate to go to the emergency department.   Monia Pouch, FNP-C [facility]  [phone]

## 2019-04-06 LAB — THYROID PANEL WITH TSH
Free Thyroxine Index: 1.8 (ref 1.2–4.9)
T3 Uptake Ratio: 25 % (ref 24–39)
T4, Total: 7.1 ug/dL (ref 4.5–12.0)
TSH: 3.07 u[IU]/mL (ref 0.450–4.500)

## 2019-04-06 LAB — CBC WITH DIFFERENTIAL/PLATELET
Basophils Absolute: 0.1 10*3/uL (ref 0.0–0.2)
Basos: 2 %
EOS (ABSOLUTE): 0.1 10*3/uL (ref 0.0–0.4)
Eos: 2 %
Hematocrit: 35.1 % — ABNORMAL LOW (ref 37.5–51.0)
Hemoglobin: 12.1 g/dL — ABNORMAL LOW (ref 13.0–17.7)
Immature Grans (Abs): 0.1 10*3/uL (ref 0.0–0.1)
Immature Granulocytes: 2 %
Lymphocytes Absolute: 0.9 10*3/uL (ref 0.7–3.1)
Lymphs: 20 %
MCH: 36.4 pg — ABNORMAL HIGH (ref 26.6–33.0)
MCHC: 34.5 g/dL (ref 31.5–35.7)
MCV: 106 fL — ABNORMAL HIGH (ref 79–97)
Monocytes Absolute: 0.5 10*3/uL (ref 0.1–0.9)
Monocytes: 11 %
Neutrophils Absolute: 2.9 10*3/uL (ref 1.4–7.0)
Neutrophils: 63 %
Platelets: 235 10*3/uL (ref 150–450)
RBC: 3.32 x10E6/uL — ABNORMAL LOW (ref 4.14–5.80)
RDW: 13.4 % (ref 11.6–15.4)
WBC: 4.6 10*3/uL (ref 3.4–10.8)

## 2019-04-06 LAB — LIPID PANEL
Chol/HDL Ratio: 2.8 ratio (ref 0.0–5.0)
Cholesterol, Total: 189 mg/dL (ref 100–199)
HDL: 67 mg/dL (ref >39)
LDL Chol Calc (NIH): 109 mg/dL — ABNORMAL HIGH (ref 0–99)
Triglycerides: 72 mg/dL (ref 0–149)
VLDL Cholesterol Cal: 13 mg/dL (ref 5–40)

## 2019-04-06 LAB — CMP14+EGFR
ALT: 4 IU/L (ref 0–44)
AST: 14 IU/L (ref 0–40)
Albumin/Globulin Ratio: 1.8 (ref 1.2–2.2)
Albumin: 4.2 g/dL (ref 3.7–4.7)
Alkaline Phosphatase: 98 IU/L (ref 39–117)
BUN/Creatinine Ratio: 8 — ABNORMAL LOW (ref 10–24)
BUN: 9 mg/dL (ref 8–27)
Bilirubin Total: 0.4 mg/dL (ref 0.0–1.2)
CO2: 23 mmol/L (ref 20–29)
Calcium: 9.2 mg/dL (ref 8.6–10.2)
Chloride: 99 mmol/L (ref 96–106)
Creatinine, Ser: 1.2 mg/dL (ref 0.76–1.27)
GFR calc Af Amer: 68 mL/min/{1.73_m2} (ref >59)
GFR calc non Af Amer: 59 mL/min/{1.73_m2} — ABNORMAL LOW (ref >59)
Globulin, Total: 2.3 g/dL (ref 1.5–4.5)
Glucose: 97 mg/dL (ref 65–99)
Potassium: 4.5 mmol/L (ref 3.5–5.2)
Sodium: 135 mmol/L (ref 134–144)
Total Protein: 6.5 g/dL (ref 6.0–8.5)

## 2019-04-06 LAB — MICROALBUMIN / CREATININE URINE RATIO
Creatinine, Urine: 42.8 mg/dL
Microalb/Creat Ratio: 24 mg/g creat (ref 0–29)
Microalbumin, Urine: 10.4 ug/mL

## 2019-04-06 MED ORDER — AMLODIPINE BESYLATE 10 MG PO TABS: 10.0000 mg | ORAL_TABLET | Freq: Every day | ORAL | 3 refills | Status: DC

## 2019-04-06 NOTE — Addendum Note (Signed)
Addended by: Baruch Gouty on: 04/06/2019 03:25 PM   Modules accepted: Orders

## 2019-04-10 ENCOUNTER — Ambulatory Visit: Payer: Medicare Other | Admitting: Neurology

## 2019-04-15 DIAGNOSIS — Z23 Encounter for immunization: Secondary | ICD-10-CM | POA: Diagnosis not present

## 2019-04-26 ENCOUNTER — Ambulatory Visit (INDEPENDENT_AMBULATORY_CARE_PROVIDER_SITE_OTHER): Payer: Medicare Other | Admitting: *Deleted

## 2019-04-26 ENCOUNTER — Other Ambulatory Visit: Payer: Self-pay

## 2019-04-26 DIAGNOSIS — Z0000 Encounter for general adult medical examination without abnormal findings: Secondary | ICD-10-CM

## 2019-04-26 NOTE — Progress Notes (Addendum)
MEDICARE ANNUAL WELLNESS VISIT  04/26/2019  Telephone Visit Disclaimer This Medicare AWV was conducted by telephone due to national recommendations for restrictions regarding the COVID-19 Pandemic (e.g. social distancing).  I verified, using two identifiers, that I am speaking with Thomas Davenport or their authorized healthcare agent. I discussed the limitations, risks, security, and privacy concerns of performing an evaluation and management service by telephone and the potential availability of an in-person appointment in the future. The patient expressed understanding and agreed to proceed.   Subjective:  Thomas Davenport is a 74 y.o. male patient of Rakes, Connye Burkitt, FNP who had a Medicare Annual Wellness Visit today via telephone. Meet is Retired and lives with their spouse. He has 3 daughters. He reports that he is socially active and does interact with friends/family regularly. He is minimally physically active and enjoys fishing.  Patient Care Team: Baruch Gouty, FNP as PCP - General (Family Medicine) Rogene Houston, MD as Consulting Physician (Gastroenterology)  Advanced Directives 04/26/2019 01/29/2019 01/29/2019 07/29/2017 05/11/2017 05/10/2017 03/06/2014  Does Patient Have a Medical Advance Directive? Yes Yes No;Yes Yes Yes Yes No  Type of Paramedic of [place];Living will Healthcare Power of [place];Living will Living will Healthcare Power of [place] -  Does patient want to make changes to medical advance directive? No - Patient declined - - - - No - Patient declined -  Copy of [place] in Chart? No - copy requested Yes - validated most recent copy scanned in chart (See row information) No - copy requested - - No - copy requested -  Would patient like information on creating a medical advance directive? - - No - Patient declined - - - -  Pre-existing out of facility DNR order  (yellow form or pink MOST form) - - - - - - -    Hospital Utilization Over the Past 12 Months: # of hospitalizations or ER visits: 1 # of surgeries: 0  Review of Systems    Patient reports that his overall health is unchanged compared to last year.  History obtained from chart review and the patient  Patient Reported Readings (BP, Pulse, CBG, Weight, etc) none  Pain Assessment Pain : No/denies pain     Current Medications & Allergies (verified) Allergies as of 04/26/2019      Reactions   Pineapple Nausea And Vomiting   Tape Itching, Other (See Comments)   *Paper tape to be used only- please*      Medication List       Accurate as of April 26, 2019 10:04 AM. If you have any questions, ask your nurse or doctor.        amLODipine 10 MG tablet Commonly known as: NORVASC Take 1 tablet (10 mg total) by mouth daily.   Flintstones Plus Iron chewable tablet Chew 1 tablet by mouth 2 (two) times daily.   VITAMIN B-12 PO Take 1 tablet by mouth daily.       History (reviewed): Past Medical History:  Diagnosis Date  . Cancer ([place]) 01/29/2019   lung cancer dx february 2020  . Cataract 2013  . COPD (chronic obstructive pulmonary disease) ([place])   . Gastrointestinal bleeding   . GERD (gastroesophageal reflux disease)    Past Surgical History:  Procedure Laterality Date  . CATARACT EXTRACTION W/PHACO  08/31/2011   Procedure: CATARACT EXTRACTION PHACO AND INTRAOCULAR LENS PLACEMENT (IOC);  Surgeon: Tonny Branch,  MD;  Location: [facility];  Service: Ophthalmology;  Laterality: Left;  CDE:22.56  . CATARACT EXTRACTION W/PHACO  09/14/2011   Procedure: CATARACT EXTRACTION PHACO AND INTRAOCULAR LENS PLACEMENT (IOC);  Surgeon: Tonny Branch, MD;  Location: [facility];  Service: Ophthalmology;  Laterality: Right;  CDE:24.60   . COLONOSCOPY N/A 07/29/2017   Procedure: COLONOSCOPY;  Surgeon: Rogene Houston, MD;  Location: [facility];  Service: Endoscopy;  Laterality: N/A;  .  ESOPHAGOGASTRODUODENOSCOPY N/A 05/11/2017   Procedure: ESOPHAGOGASTRODUODENOSCOPY (EGD);  Surgeon: Rogene Houston, MD;  [facility];  Service: Endoscopy;  Laterality: N/A;  . ESOPHAGOGASTRODUODENOSCOPY N/A 07/29/2017   Procedure: ESOPHAGOGASTRODUODENOSCOPY (EGD);  Surgeon: Rogene Houston, MD;  Location: [facility];  Service: Endoscopy;  Laterality: N/A;  1000   Family History  Problem Relation Age of Onset  . Tuberculosis Father   . Cancer Brother        lung  . Heart attack Brother 44   Social History   Socioeconomic History  . Marital status: Married    Spouse name: Regino Schultze  . Number of children: 3  . Years of education: GED  . Highest education level: GED or equivalent  Occupational History  . Occupation: Retired  Tobacco Use  . Smoking status: Current Every Day Smoker    Packs/day: 1.50    Years: 59.00    Pack years: 88.50    Types: Cigarettes  . Smokeless tobacco: Never Used  Substance and Sexual Activity  . Alcohol use: Yes    Alcohol/week: 14.0 standard drinks    Types: 14 Cans of beer per week    Comment: about 2 cans of beer a day  . Drug use: No  . Sexual activity: Yes    Birth control/protection: None  Other Topics Concern  . Not on file  Social History Narrative   Married with 3 children, all girls, 4 grandchildren, 3 great grandchildren   Social Determinants of Health   Financial Resource Strain:   . Difficulty of Paying Living Expenses: Not on file  Food Insecurity:   . Worried About Charity fundraiser in the Last Year: Not on file  . Ran Out of Food in the Last Year: Not on file  Transportation Needs:   . Lack of Transportation (Medical): Not on file  . Lack of Transportation (Non-Medical): Not on file  Physical Activity:   . Days of Exercise per Week: Not on file  . Minutes of Exercise per Session: Not on file  Stress:   . Feeling of Stress : Not on file  Social Connections:   . Frequency of Communication with Friends and  Family: Not on file  . Frequency of Social Gatherings with Friends and Family: Not on file  . Attends Religious Services: Not on file  . Active Member of Clubs or Organizations: Not on file  . Attends Archivist Meetings: Not on file  . Marital Status: Not on file    Activities of Daily Living In your present state of health, do you have any difficulty performing the following activities: 04/26/2019 01/29/2019  Hearing? N N  Vision? N Y  Difficulty concentrating or making decisions? N N  Walking or climbing stairs? Y N  Comment If no handrail available -  Dressing or bathing? N N  Doing errands, shopping? Y -  Comment Unable to drive due to BP concerns -  Preparing Food and eating ? N -  Using the Toilet? N -  In the  past six months, have you accidently leaked urine? N -  Do you have problems with loss of bowel control? N -  Managing your Medications? N -  Managing your Finances? N -  Housekeeping or managing your Housekeeping? N -  Some recent data might be hidden    Patient Education/ Literacy How often do you need to have someone help you when you read instructions, pamphlets, or other written materials from your doctor or pharmacy?: 1 - Never What is the last grade level you completed in school?: GED in the [employer]  Exercise Current Exercise Habits: Home exercise routine, Type of exercise: walking, Time (Minutes): 20, Frequency (Times/Week): 7, Weekly Exercise (Minutes/Week): 140, Intensity: Mild  Diet Patient reports consuming 3 meals a day and 3 snack(s) a day Patient reports that his primary diet is: Low Sodium Patient reports that she does have regular access to food.   Depression Screen PHQ 2/9 Scores 04/26/2019 04/05/2019 02/06/2019 11/16/2017 05/21/2017 09/29/2016 08/26/2016  PHQ - 2 Score 0 0 0 0 0 0 0  PHQ- 9 Score - - 0 - - - -     Fall Risk Fall Risk  04/26/2019 04/05/2019 02/06/2019 11/16/2017 11/16/2017  Falls in the past year? 0 0 0 No No  Comment - - - - -    Number falls in past yr: - - - - -  Injury with Fall? - - - - -  Risk for fall due to : - - - History of fall(s) -     Objective:  Thomas Davenport seemed alert and oriented and he participated appropriately during our telephone visit.  Blood Pressure Weight BMI  BP Readings from Last 3 Encounters:  04/05/19 (!) 171/73  02/06/19 (!) 165/70  01/30/19 (!) 170/76   Wt Readings from Last 3 Encounters:  04/05/19 144 lb (65.3 kg)  02/06/19 149 lb (67.6 kg)  01/29/19 141 lb 5 oz (64.1 kg)   BMI Readings from Last 1 Encounters:  04/05/19 19.53 kg/m    *Unable to obtain current vital signs, weight, and BMI due to telephone visit type  Hearing/Vision  . Jaylee did not seem to have difficulty with hearing/understanding during the telephone conversation . Reports that he has not had a formal eye exam by an eye care professional within the past year . Reports that he has not had a formal hearing evaluation within the past year *Unable to fully assess hearing and vision during telephone visit type  Cognitive Function: 6CIT Screen 04/26/2019 11/16/2017  What Year? 0 points 0 points  What month? 0 points 0 points  What time? 0 points 0 points  Count back from 20 0 points 0 points  Months in reverse 2 points 0 points  Repeat phrase 2 points 0 points  Total Score 4 0   (Normal:0-7, Significant for Dysfunction: >8)  Normal Cognitive Function Screening: Yes   Immunization & Health Maintenance Record  There is no immunization history on file for this patient.  Health Maintenance  Topic Date Due  . PNA vac Low Risk Adult (1 of 2 - PCV13) 10/27/19 (Originally 10/23/2008)  . TETANUS/TDAP  10/18/2020  . COLONOSCOPY  07/30/2027  . INFLUENZA VACCINE  Discontinued  . Hepatitis C Screening  Discontinued       Assessment  This is a routine wellness examination for Thomas Davenport.  Health Maintenance: Due or Overdue There are no preventive care reminders to display for this  patient.  ANGELUS HOOPES does not need a  referral for Community Assistance: Care Management:   no Social Work:    no Prescription Assistance:  no Nutrition/Diabetes Education:  no   Plan:  Personalized Goals Goals Addressed            This Visit's Progress   . awv       04/26/2019 AWV Goal: Fall Prevention  . Over the next year, patient will decrease their risk for falls by: o Using assistive devices, such as a cane or walker, as needed o Identifying fall risks within their home and correcting them by: - Removing throw rugs - Adding handrails to stairs or ramps - Removing clutter and keeping a clear pathway throughout the home - Increasing light, especially at night - Adding shower handles/bars - Raising toilet seat o Identifying potential personal risk factors for falls: - Medication side effects - Incontinence/urgency - Vestibular dysfunction - Hearing loss - Musculoskeletal disorders - Neurological disorders - Orthostatic hypotension  04/26/2019 AWV Goal: Tobacco Cessation  Smoking cessation instruction/counseling given:  counseled patient on the dangers of tobacco use, advised patient to stop smoking, and reviewed strategies to maximize success   Patient will verbalize understanding of the health risks associated with smoking/tobacco use  Lung cancer or lung disease, such as COPD  Heart disease.  Stroke.  Heart attack  Infertility  Osteoporosis and bone fractures. . Patient will create a plan to quit smoking/using tobacco  Pick a date to quit.   Write down the reasons why you are quitting and put it where you will see it often.  Identify the people, places, things, and activities that make you want to smoke (triggers) and avoid them. Make sure to take these actions: ? Throw away all cigarettes at home, at work, and in your car. ? Throw away smoking accessories, such as Scientist, research (medical). ? Clean your car and make sure to empty the  ashtray. ? Clean your home, including curtains and carpets.  Tell your family, friends, and coworkers that you are quitting. Support from your loved ones can make quitting easier.  Talk with your health care provider about your options for quitting smoking.  Find out what treatment options are covered by your health insurance. . Patient will be able to demonstrate knowledge of tobacco cessation strategies that may maximize success  Quitting "cold [place]" is more successful than gradually quitting.  Attending in-person counseling to help you build problem-solving skills.   Finding resources and support systems that can help you to quit smoking and remain smoke-free after you quit. These resources are most helpful when you use them often. They can include: ? Online chats with a Social worker. ? Telephone quitlines. ? Careers information officer. ? Support groups or group counseling. ? Text messaging programs. ? Mobile phone applications.  Taking medicines to help you quit smoking: ? Nicotine patches, gum, or lozenges. ? Nicotine inhalers or sprays. ? Non-nicotine medicine that is taken by mouth. . Patient will note get discouraged if the process is difficult . Over the next year, patient will stop smoking or using other forms of tobacco  Smoking cessation instruction/counseling given:  counseled patient on the dangers of tobacco use, advised patient to stop smoking, and reviewed strategies to maximize success        Personalized Health Maintenance & Screening Recommendations  Pneumococcal vaccine   Eye Exam   Lung Cancer Screening Recommended: yes (Low Dose CT Chest recommended if Age 87-80 years, 30 pack-year currently smoking OR have quit w/in past 15 years) Hepatitis  C Screening recommended: yes HIV Screening recommended: no  Advanced Directives: Written information was not prepared per patient's request.  Referrals & Orders No orders of the defined types were placed in  this encounter.   Follow-up Plan . Follow-up with Baruch Gouty, FNP as planned . Schedule Your yearly eye exam . We will discuss pneumonia vaccine at next visit.   I have personally reviewed and noted the following in the patient's chart:   . Medical and social history . Use of alcohol, tobacco or illicit drugs  . Current medications and supplements . Functional ability and status . Nutritional status . Physical activity . Advanced directives . List of other physicians . Hospitalizations, surgeries, and ER visits in previous 12 months . Vitals . Screenings to include cognitive, depression, and falls . Referrals and appointments  In addition, I have reviewed and discussed with Thomas Davenport certain preventive protocols, quality metrics, and best practice recommendations. A written personalized care plan for preventive services as well as general preventive health recommendations is available and can be mailed to the patient at his request.      Gareth Morgan, LPN  03/28/1094

## 2019-05-15 DIAGNOSIS — Z23 Encounter for immunization: Secondary | ICD-10-CM | POA: Diagnosis not present

## 2019-06-01 ENCOUNTER — Encounter: Payer: Self-pay | Admitting: *Deleted

## 2019-06-30 ENCOUNTER — Encounter: Payer: Self-pay | Admitting: Family Medicine

## 2019-06-30 ENCOUNTER — Other Ambulatory Visit: Payer: Self-pay

## 2019-06-30 ENCOUNTER — Ambulatory Visit (INDEPENDENT_AMBULATORY_CARE_PROVIDER_SITE_OTHER): Payer: Medicare Other | Admitting: Family Medicine

## 2019-06-30 VITALS — BP 149/75 | HR 90 | Temp 99.8°F | Ht 72.0 in | Wt 145.0 lb

## 2019-06-30 DIAGNOSIS — Z720 Tobacco use: Secondary | ICD-10-CM

## 2019-06-30 DIAGNOSIS — J418 Mixed simple and mucopurulent chronic bronchitis: Secondary | ICD-10-CM | POA: Diagnosis not present

## 2019-06-30 DIAGNOSIS — F1010 Alcohol abuse, uncomplicated: Secondary | ICD-10-CM | POA: Diagnosis not present

## 2019-06-30 DIAGNOSIS — I10 Essential (primary) hypertension: Secondary | ICD-10-CM | POA: Diagnosis not present

## 2019-06-30 LAB — CMP14+EGFR
ALT: 6 IU/L (ref 0–44)
AST: 13 IU/L (ref 0–40)
Albumin/Globulin Ratio: 1.8 (ref 1.2–2.2)
Albumin: 3.8 g/dL (ref 3.7–4.7)
Alkaline Phosphatase: 113 IU/L (ref 39–117)
BUN/Creatinine Ratio: 7 — ABNORMAL LOW (ref 10–24)
BUN: 9 mg/dL (ref 8–27)
Bilirubin Total: 0.4 mg/dL (ref 0.0–1.2)
CO2: 20 mmol/L (ref 20–29)
Calcium: 8.6 mg/dL (ref 8.6–10.2)
Chloride: 103 mmol/L (ref 96–106)
Creatinine, Ser: 1.31 mg/dL — ABNORMAL HIGH (ref 0.76–1.27)
GFR calc Af Amer: 61 mL/min/{1.73_m2} (ref >59)
GFR calc non Af Amer: 53 mL/min/{1.73_m2} — ABNORMAL LOW (ref >59)
Globulin, Total: 2.1 g/dL (ref 1.5–4.5)
Glucose: 90 mg/dL (ref 65–99)
Potassium: 3.8 mmol/L (ref 3.5–5.2)
Sodium: 139 mmol/L (ref 134–144)
Total Protein: 5.9 g/dL — ABNORMAL LOW (ref 6.0–8.5)

## 2019-06-30 LAB — CBC WITH DIFFERENTIAL/PLATELET
Basophils Absolute: 0.1 10*3/uL (ref 0.0–0.2)
Basos: 1 %
EOS (ABSOLUTE): 0.1 10*3/uL (ref 0.0–0.4)
Eos: 1 %
Hematocrit: 29.9 % — ABNORMAL LOW (ref 37.5–51.0)
Hemoglobin: 10.5 g/dL — ABNORMAL LOW (ref 13.0–17.7)
Immature Grans (Abs): 0.1 10*3/uL (ref 0.0–0.1)
Immature Granulocytes: 1 %
Lymphocytes Absolute: 0.8 10*3/uL (ref 0.7–3.1)
Lymphs: 11 %
MCH: 36.1 pg — ABNORMAL HIGH (ref 26.6–33.0)
MCHC: 35.1 g/dL (ref 31.5–35.7)
MCV: 103 fL — ABNORMAL HIGH (ref 79–97)
Monocytes Absolute: 0.9 10*3/uL (ref 0.1–0.9)
Monocytes: 13 %
Neutrophils Absolute: 5.1 10*3/uL (ref 1.4–7.0)
Neutrophils: 73 %
Platelets: 211 10*3/uL (ref 150–450)
RBC: 2.91 x10E6/uL — ABNORMAL LOW (ref 4.14–5.80)
RDW: 14.4 % (ref 11.6–15.4)
WBC: 7.1 10*3/uL (ref 3.4–10.8)

## 2019-06-30 MED ORDER — AMLODIPINE BESYLATE 10 MG PO TABS: 10.0000 mg | ORAL_TABLET | Freq: Every day | ORAL | 3 refills | Status: AC

## 2019-06-30 NOTE — Patient Instructions (Signed)
DASH Eating Plan DASH stands for "Dietary Approaches to Stop Hypertension." The DASH eating plan is a healthy eating plan that has been shown to reduce high blood pressure (hypertension). Additional health benefits may include reducing the risk of type 2 diabetes mellitus, heart disease, and stroke. The DASH eating plan may also help with weight loss.  WHAT DO I NEED TO KNOW ABOUT THE DASH EATING PLAN? For the DASH eating plan, you will follow these general guidelines:  Choose foods with a percent daily value for sodium of less than 5% (as listed on the food label).  Use salt-free seasonings or herbs instead of table salt or sea salt.  Check with your health care provider or pharmacist before using salt substitutes.  Eat lower-sodium products, often labeled as "lower sodium" or "no salt added."  Eat fresh foods.  Eat more vegetables, fruits, and low-fat dairy products.  Choose whole grains. Look for the word "whole" as the first word in the ingredient list.  Choose fish and skinless chicken or turkey more often than red meat. Limit fish, poultry, and meat to 6 oz (170 g) each day.  Limit sweets, desserts, sugars, and sugary drinks.  Choose heart-healthy fats.  Limit cheese to 1 oz (28 g) per day.  Eat more home-cooked food and less restaurant, buffet, and fast food.  Limit fried foods.  Cook foods using methods other than frying.  Limit canned vegetables. If you do use them, rinse them well to decrease the sodium.  When eating at a restaurant, ask that your food be prepared with less salt, or no salt if possible.  WHAT FOODS CAN I EAT? Seek help from a dietitian for individual calorie needs.  Grains Whole grain or whole wheat bread. Brown rice. Whole grain or whole wheat pasta. Quinoa, bulgur, and whole grain cereals. Low-sodium cereals. Corn or whole wheat flour tortillas. Whole grain cornbread. Whole grain crackers. Low-sodium crackers.  Vegetables Fresh or frozen  vegetables (raw, steamed, roasted, or grilled). Low-sodium or reduced-sodium tomato and vegetable juices. Low-sodium or reduced-sodium tomato sauce and paste. Low-sodium or reduced-sodium canned vegetables.   Fruits All fresh, canned (in natural juice), or frozen fruits.  Meat and Other Protein Products Ground beef (85% or leaner), grass-fed beef, or beef trimmed of fat. Skinless chicken or turkey. Ground chicken or turkey. Pork trimmed of fat. All fish and seafood. Eggs. Dried beans, peas, or lentils. Unsalted nuts and seeds. Unsalted canned beans.  Dairy Low-fat dairy products, such as skim or 1% milk, 2% or reduced-fat cheeses, low-fat ricotta or cottage cheese, or plain low-fat yogurt. Low-sodium or reduced-sodium cheeses.  Fats and Oils Tub margarines without trans fats. Light or reduced-fat mayonnaise and salad dressings (reduced sodium). Avocado. Safflower, olive, or canola oils. Natural peanut or almond butter.  Other Unsalted popcorn and pretzels. The items listed above may not be a complete list of recommended foods or beverages. Contact your dietitian for more options.  WHAT FOODS ARE NOT RECOMMENDED?  Grains White bread. White pasta. White rice. Refined cornbread. Bagels and croissants. Crackers that contain trans fat.  Vegetables Creamed or fried vegetables. Vegetables in a cheese sauce. Regular canned vegetables. Regular canned tomato sauce and paste. Regular tomato and vegetable juices.  Fruits Dried fruits. Canned fruit in light or heavy syrup. Fruit juice.  Meat and Other Protein Products Fatty cuts of meat. Ribs, chicken wings, bacon, sausage, bologna, salami, chitterlings, fatback, hot dogs, bratwurst, and packaged luncheon meats. Salted nuts and seeds. Canned beans with salt.    Dairy Whole or 2% milk, cream, half-and-half, and cream cheese. Whole-fat or sweetened yogurt. Full-fat cheeses or blue cheese. Nondairy creamers and whipped toppings. Processed cheese,  cheese spreads, or cheese curds.  Condiments Onion and garlic salt, seasoned salt, table salt, and sea salt. Canned and packaged gravies. Worcestershire sauce. Tartar sauce. Barbecue sauce. Teriyaki sauce. Soy sauce, including reduced sodium. Steak sauce. Fish sauce. Oyster sauce. Cocktail sauce. Horseradish. Ketchup and mustard. Meat flavorings and tenderizers. Bouillon cubes. Hot sauce. Tabasco sauce. Marinades. Taco seasonings. Relishes.  Fats and Oils Butter, stick margarine, lard, shortening, ghee, and bacon fat. Coconut, palm kernel, or palm oils. Regular salad dressings.  Other Pickles and olives. Salted popcorn and pretzels.  The items listed above may not be a complete list of foods and beverages to avoid. Contact your dietitian for more information.  WHERE CAN I FIND MORE INFORMATION? National Heart, Lung, and Blood Institute: www.nhlbi.nih.gov/health/health-topics/topics/dash/ Document Released: 02/26/2011 Document Revised: 07/24/2013 Document Reviewed: 01/11/2013 ExitCare Patient Information 2015 [employer]. This information is not intended to replace advice given to you by your health care provider. Make sure you discuss any questions you have with your health care provider.   I think that you would greatly benefit from seeing a nutritionist.  If you are interested, please call Dr Sykes at [phone] to schedule an appointment.   

## 2019-06-30 NOTE — Progress Notes (Signed)
Subjective:  Patient ID: Thomas Davenport, male    DOB: June 13, 1945, 74 y.o.   MRN: [id number]  Patient Care Team: Loman Brooklyn, FNP as PCP - General (Family Medicine) Rogene Houston, MD as Consulting Physician (Gastroenterology)   Chief Complaint:  Medical Management of Chronic Issues and Hypertension   HPI: Thomas Davenport is a 74 y.o. male presenting on 06/30/2019 for Medical Management of Chronic Issues and Hypertension   1. Mixed simple and mucopurulent chronic bronchitis (HCC) Pt states he only has a cough with minimal shortness of breath in the mornings after waking. States he does not use inhalers and does not wish to start.   2. Essential hypertension Taking medications as prescribed and tolerating well. No chest pain, leg swelling, headaches, weakness, confusion, or headaches.   3. Alcohol abuse States he drinks at least 5 beers per day. No recent falls. No confusion or weakness. Does not wish to stop drinking.   4. Tobacco abuse Smokes at least  PPD and does not wish to stop.      Relevant past medical, surgical, family, and social history reviewed and updated as indicated.  Allergies and medications reviewed and updated. Date reviewed: Chart in Epic.   Past Medical History:  Diagnosis Date  . Cancer ([place]) 01/29/2019   lung cancer dx february 2020  . Cataract 2013  . COPD (chronic obstructive pulmonary disease) ([place])   . Gastrointestinal bleeding   . GERD (gastroesophageal reflux disease)     Past Surgical History:  Procedure Laterality Date  . CATARACT EXTRACTION W/PHACO  08/31/2011   Procedure: CATARACT EXTRACTION PHACO AND INTRAOCULAR LENS PLACEMENT (IOC);  Surgeon: Tonny Branch, MD;  Location: [facility];  Service: Ophthalmology;  Laterality: Left;  CDE:22.56  . CATARACT EXTRACTION W/PHACO  09/14/2011   Procedure: CATARACT EXTRACTION PHACO AND INTRAOCULAR LENS PLACEMENT (IOC);  Surgeon: Tonny Branch, MD;  Location: [facility];  Service: Ophthalmology;  Laterality:  Right;  CDE:24.60   . COLONOSCOPY N/A 07/29/2017   Procedure: COLONOSCOPY;  Surgeon: Rogene Houston, MD;  Location: [facility];  Service: Endoscopy;  Laterality: N/A;  . ESOPHAGOGASTRODUODENOSCOPY N/A 05/11/2017   Procedure: ESOPHAGOGASTRODUODENOSCOPY (EGD);  Surgeon: Rogene Houston, MD;  Location: [facility];  Service: Endoscopy;  Laterality: N/A;  . ESOPHAGOGASTRODUODENOSCOPY N/A 07/29/2017   Procedure: ESOPHAGOGASTRODUODENOSCOPY (EGD);  Surgeon: Rogene Houston, MD;  Location: [facility];  Service: Endoscopy;  Laterality: N/A;  1000    Social History   Socioeconomic History  . Marital status: Married    Spouse name: Regino Schultze  . Number of children: 3  . Years of education: GED  . Highest education level: GED or equivalent  Occupational History  . Occupation: Retired  Tobacco Use  . Smoking status: Current Every Day Smoker    Packs/day: 1.50    Years: 59.00    Pack years: 88.50    Types: Cigarettes  . Smokeless tobacco: Never Used  Substance and Sexual Activity  . Alcohol use: Yes    Alcohol/week: 14.0 standard drinks    Types: 14 Cans of beer per week    Comment: about 2 cans of beer a day  . Drug use: No  . Sexual activity: Yes    Birth control/protection: None  Other Topics Concern  . Not on file  Social History Narrative   Married with 3 children, all girls, 4 grandchildren, 3 great grandchildren   Social Determinants of Health   Financial Resource Strain:   .  Difficulty of Paying Living Expenses:   Food Insecurity:   . Worried About Charity fundraiser in the Last Year:   . Arboriculturist in the Last Year:   Transportation Needs:   . Film/video editor (Medical):   Marland Kitchen Lack of Transportation (Non-Medical):   Physical Activity:   . Days of Exercise per Week:   . Minutes of Exercise per Session:   Stress:   . Feeling of Stress :   Social Connections:   . Frequency of Communication with Friends and Family:   . Frequency of Social Gatherings with  Friends and Family:   . Attends Religious Services:   . Active Member of Clubs or Organizations:   . Attends Archivist Meetings:   Marland Kitchen Marital Status:   Intimate Partner Violence:   . Fear of Current or Ex-Partner:   . Emotionally Abused:   Marland Kitchen Physically Abused:   . Sexually Abused:     Outpatient Encounter Medications as of 06/30/2019  Medication Sig  . amLODipine (NORVASC) 10 MG tablet Take 1 tablet (10 mg total) by mouth daily.  . Cyanocobalamin (VITAMIN B-12 PO) Take 1 tablet by mouth daily.  . Pediatric Multivitamins-Iron (FLINTSTONES PLUS IRON) chewable tablet Chew 1 tablet by mouth 2 (two) times daily.  . [DISCONTINUED] amLODipine (NORVASC) 10 MG tablet Take 1 tablet (10 mg total) by mouth daily.   No facility-administered encounter medications on file as of 06/30/2019.    Allergies  Allergen Reactions  . Pineapple Nausea And Vomiting  . Tape Itching and Other (See Comments)    *Paper tape to be used only- please*    Review of Systems  Constitutional: Negative for activity change, appetite change, chills, diaphoresis, fatigue, fever and unexpected weight change.  HENT: Negative.   Eyes: Negative.  Negative for photophobia and visual disturbance.  Respiratory: Positive for cough (only in morning) and shortness of breath (only in morning). Negative for chest tightness.   Cardiovascular: Negative for chest pain, palpitations and leg swelling.  Gastrointestinal: Negative for abdominal pain, blood in stool, constipation, diarrhea, nausea and vomiting.  Endocrine: Negative.  Negative for cold intolerance, heat intolerance, polydipsia, polyphagia and polyuria.  Genitourinary: Negative for decreased urine volume, difficulty urinating, dysuria, frequency and urgency.  Musculoskeletal: Negative for arthralgias and myalgias.  Skin: Negative.   Allergic/Immunologic: Negative.   Neurological: Negative for dizziness, tremors, seizures, syncope, facial asymmetry, speech  difficulty, weakness, light-headedness, numbness and headaches.  Hematological: Negative.  Does not bruise/bleed easily.  Psychiatric/Behavioral: Negative for confusion, hallucinations, sleep disturbance and suicidal ideas.  All other systems reviewed and are negative.       Objective:  BP (!) 149/75   Pulse 90   Temp 99.8 F (37.7 C)   Ht 6' (1.829 m)   Wt 145 lb (65.8 kg)   SpO2 96%   BMI 19.67 kg/m    Wt Readings from Last 3 Encounters:  06/30/19 145 lb (65.8 kg)  04/05/19 144 lb (65.3 kg)  02/06/19 149 lb (67.6 kg)    Physical Exam Vitals and nursing note reviewed.  Constitutional:      General: He is not in acute distress.    Appearance: Normal appearance. He is well-developed, well-groomed and normal weight. He is not ill-appearing, toxic-appearing or diaphoretic.  HENT:     Head: Normocephalic and atraumatic.     Jaw: There is normal jaw occlusion.     Right Ear: Hearing normal.     Left Ear: Hearing normal.  Nose: Nose normal.     Mouth/Throat:     Lips: Pink.     Mouth: Mucous membranes are moist.     Pharynx: Oropharynx is clear. Uvula midline.  Eyes:     General: Lids are normal.     Extraocular Movements: Extraocular movements intact.     Conjunctiva/sclera: Conjunctivae normal.     Pupils: Pupils are equal, round, and reactive to light.  Neck:     Thyroid: No thyroid mass, thyromegaly or thyroid tenderness.     Vascular: No carotid bruit or JVD.     Trachea: Trachea and phonation normal.  Cardiovascular:     Rate and Rhythm: Normal rate and regular rhythm.     Chest Wall: PMI is not displaced.     Pulses: Normal pulses.     Heart sounds: Normal heart sounds. No murmur. No friction rub. No gallop.   Pulmonary:     Effort: Pulmonary effort is normal. No respiratory distress.     Breath sounds: Rhonchi present. No wheezing.  Abdominal:     General: Bowel sounds are normal. There is no distension or abdominal bruit.     Palpations: Abdomen is  soft. There is no hepatomegaly or splenomegaly.     Tenderness: There is no abdominal tenderness. There is no right CVA tenderness or left CVA tenderness.     Hernia: No hernia is present.  Musculoskeletal:        General: Normal range of motion.     Cervical back: Normal range of motion and neck supple.     Right lower leg: No edema.     Left lower leg: No edema.  Lymphadenopathy:     Cervical: No cervical adenopathy.  Skin:    General: Skin is warm and dry.     Capillary Refill: Capillary refill takes less than 2 seconds.     Coloration: Skin is not cyanotic, jaundiced or pale.     Findings: No rash.     Comments: Nasal telangiectasia   Neurological:     General: No focal deficit present.     Mental Status: He is alert and oriented to person, place, and time.     Cranial Nerves: Cranial nerves are intact. No cranial nerve deficit.     Sensory: Sensation is intact. No sensory deficit.     Motor: Motor function is intact. No weakness.     Coordination: Coordination is intact. Coordination normal.     Gait: Gait is intact. Gait normal.     Deep Tendon Reflexes: Reflexes are normal and symmetric. Reflexes normal.  Psychiatric:        Attention and Perception: Attention and perception normal.        Mood and Affect: Mood and affect normal.        Speech: Speech normal.        Behavior: Behavior normal. Behavior is cooperative.        Thought Content: Thought content normal.        Cognition and Memory: Cognition and memory normal.        Judgment: Judgment normal.     Results for orders placed or performed in visit on 04/05/19  CBC with Differential/Platelet  Result Value Ref Range   WBC 4.6 3.4 - 10.8 x10E3/uL   RBC 3.32 (L) 4.14 - 5.80 x10E6/uL   Hemoglobin 12.1 (L) 13.0 - 17.7 g/dL   Hematocrit 35.1 (L) 37.5 - 51.0 %   MCV 106 (H) 79 - 97 fL  MCH 36.4 (H) 26.6 - 33.0 pg   MCHC 34.5 31.5 - 35.7 g/dL   RDW 13.4 11.6 - 15.4 %   Platelets 235 150 - 450 x10E3/uL    Neutrophils 63 Not Estab. %   Lymphs 20 Not Estab. %   Monocytes 11 Not Estab. %   Eos 2 Not Estab. %   Basos 2 Not Estab. %   Neutrophils Absolute 2.9 1.4 - 7.0 x10E3/uL   Lymphocytes Absolute 0.9 0.7 - 3.1 x10E3/uL   Monocytes Absolute 0.5 0.1 - 0.9 x10E3/uL   EOS (ABSOLUTE) 0.1 0.0 - 0.4 x10E3/uL   Basophils Absolute 0.1 0.0 - 0.2 x10E3/uL   Immature Granulocytes 2 Not Estab. %   Immature Grans (Abs) 0.1 0.0 - 0.1 x10E3/uL  CMP14+EGFR  Result Value Ref Range   Glucose 97 65 - 99 mg/dL   BUN 9 8 - 27 mg/dL   Creatinine, Ser 1.20 0.76 - 1.27 mg/dL   GFR calc non Af Amer 59 (L) >59 mL/min/1.73   GFR calc Af Amer 68 >59 mL/min/1.73   BUN/Creatinine Ratio 8 (L) 10 - 24   Sodium 135 134 - 144 mmol/L   Potassium 4.5 3.5 - 5.2 mmol/L   Chloride 99 96 - 106 mmol/L   CO2 23 20 - 29 mmol/L   Calcium 9.2 8.6 - 10.2 mg/dL   Total Protein 6.5 6.0 - 8.5 g/dL   Albumin 4.2 3.7 - 4.7 g/dL   Globulin, Total 2.3 1.5 - 4.5 g/dL   Albumin/Globulin Ratio 1.8 1.2 - 2.2   Bilirubin Total 0.4 0.0 - 1.2 mg/dL   Alkaline Phosphatase 98 39 - 117 IU/L   AST 14 0 - 40 IU/L   ALT 4 0 - 44 IU/L  Lipid panel  Result Value Ref Range   Cholesterol, Total 189 100 - 199 mg/dL   Triglycerides 72 0 - 149 mg/dL   HDL 67 >39 mg/dL   VLDL Cholesterol Cal 13 5 - 40 mg/dL   LDL Chol Calc (NIH) 109 (H) 0 - 99 mg/dL   Chol/HDL Ratio 2.8 0.0 - 5.0 ratio  Thyroid Panel With TSH  Result Value Ref Range   TSH 3.070 0.450 - 4.500 uIU/mL   T4, Total 7.1 4.5 - 12.0 ug/dL   T3 Uptake Ratio 25 24 - 39 %   Free Thyroxine Index 1.8 1.2 - 4.9  Microalbumin / creatinine urine ratio  Result Value Ref Range   Creatinine, Urine 42.8 Not Estab. mg/dL   Microalbumin, Urine 10.4 Not Estab. ug/mL   Microalb/Creat Ratio 24 0 - 29 mg/g creat       Pertinent labs & imaging results that were available during my care of the patient were reviewed by me and considered in my medical decision making.  Assessment & Plan:  Thomas Davenport  was seen today for medical management of chronic issues and hypertension.  Diagnoses and all orders for this visit:  Mixed simple and mucopurulent chronic bronchitis ([place]) Pt states he only has symptoms upon waking in the mornings and declines medications as this time.   Essential hypertension BP fairly controlled. Changes were not made in regimen today. Goal BP is 140/90. Pt aware to report any persistent high or low readings. DASH diet and exercise encouraged. Exercise at least 150 minutes per week and increase as tolerated. Goal BMI > 25. Stress management encouraged. Avoid nicotine and tobacco product use. Avoid excessive alcohol and NSAID's. Avoid more than 2000 mg of sodium daily. Medications as prescribed. Follow  up as scheduled.  -     amLODipine (NORVASC) 10 MG tablet; Take 1 tablet (10 mg total) by mouth daily. -     CBC with Differential/Platelet -     CMP14+EGFR  Alcohol abuse Declines alcohol cessation counseling at this time.  -     CBC with Differential/Platelet -     CMP14+EGFR  Tobacco abuse Declined smoking cessation at this time.     Continue all other maintenance medications.  Follow up plan: Return in about 6 months (around 12/30/2019), or if symptoms worsen or fail to improve, for HTN.   Continue healthy lifestyle choices, including diet (rich in fruits, vegetables, and lean proteins, and low in salt and simple carbohydrates) and exercise (at least 30 minutes of moderate physical activity daily.   Educational handout given for DASH diet  The above assessment and management plan was discussed with the patient. The patient verbalized understanding of and has agreed to the management plan. Patient is aware to call the clinic if they develop any new symptoms or if symptoms persist or worsen. Patient is aware when to return to the clinic for a follow-up visit. Patient educated on when it is appropriate to go to the emergency department.   Monia Pouch,  FNP-C [facility] [phone]

## 2019-07-06 ENCOUNTER — Ambulatory Visit: Payer: Medicare Other | Admitting: Family Medicine

## 2019-08-22 ENCOUNTER — Ambulatory Visit (INDEPENDENT_AMBULATORY_CARE_PROVIDER_SITE_OTHER): Payer: Medicare Other | Admitting: Gastroenterology

## 2019-08-30 ENCOUNTER — Other Ambulatory Visit: Payer: Self-pay

## 2019-08-30 ENCOUNTER — Ambulatory Visit (INDEPENDENT_AMBULATORY_CARE_PROVIDER_SITE_OTHER): Payer: Medicare Other | Admitting: Family Medicine

## 2019-08-30 ENCOUNTER — Encounter: Payer: Self-pay | Admitting: Family Medicine

## 2019-08-30 VITALS — BP 134/67 | HR 71 | Temp 97.3°F | Ht 73.0 in | Wt 126.6 lb

## 2019-08-30 DIAGNOSIS — R918 Other nonspecific abnormal finding of lung field: Secondary | ICD-10-CM

## 2019-08-30 DIAGNOSIS — R634 Abnormal weight loss: Secondary | ICD-10-CM | POA: Diagnosis not present

## 2019-08-30 DIAGNOSIS — J418 Mixed simple and mucopurulent chronic bronchitis: Secondary | ICD-10-CM | POA: Diagnosis not present

## 2019-08-30 DIAGNOSIS — Z720 Tobacco use: Secondary | ICD-10-CM

## 2019-08-30 DIAGNOSIS — R5383 Other fatigue: Secondary | ICD-10-CM | POA: Diagnosis not present

## 2019-08-30 DIAGNOSIS — K118 Other diseases of salivary glands: Secondary | ICD-10-CM | POA: Diagnosis not present

## 2019-08-30 NOTE — Progress Notes (Signed)
Assessment & Plan:  1-4. Multiple pulmonary nodules/Nodule of parotid gland/Unintended weight loss/Fatigue - Discussed the option of referring patient to an oncologist for further work-up and to discuss treatment options.  He does not wish to do this.  He understands the weight loss and fatigue is due to the undiagnosed/probable lung cancer.  We did also discuss a referral to hospice.  Patient would like to think about this and discuss it with his other 2 daughters that are not present today.  His wife and daughter that are present are agreeable and think it would be a good idea, but obviously want this to be his decision.  I did advise that if they give me a call or send me a MyChart message I would be happy to place the referral at any time.  5-6. Mixed simple and mucopurulent chronic bronchitis (HCC)/Tobacco abuse - Patient continues to smoke and has no desire to quit.   Follow up plan: No follow-ups on file.  Hendricks Limes, MSN, APRN, FNP-C [facility]  Subjective:   Patient ID: Thomas Davenport, male    DOB: 02/01/1946, 74 y.o.   MRN: [id number]  HPI: Thomas Davenport is a 75 y.o. male presenting on 08/30/2019 for Weight Loss (x 1 month.  Daughter states that he has lung cancer but patient is refrusing to see an oncologist.  Also states that patients voice has changed.), Fatigue (x 1 month ), and Dizziness  Patient is accompanied today by his wife and daughter, both of whom he is okay with being present.  Patient is here with concerns of weight loss and fatigue.  Patient has lost 19 pounds in the past 2 months.  He reports he has a very good appetite and does not miss any meals.  His wife and daughter confirmed that he eats good at all meals.  Patient had a PET scan completed in March 2020 that revealed a 1.6 cm hypermetabolic left lower lobe pulmonary nodule, highly suspicious for primary bronchogenic carcinoma.  There was also a persistent 8 mm groundglass nodule in the  posterior right upper lobe where a low-grade adenocarcinoma could not be excluded.  Additionally there were 2 subcentimeter hypermetabolic nodules in the right parotid gland suspicious for tiny parotid neoplasms.  At that time patient opted for no further work-up and no treatment of what he was told was most likely lung cancer.  Patient continues to smoke more than a pack of cigarettes a day.  His daughter states he has not cut down at all since being given this information.  He does have a cough but does not produce any blood with it.  Patient reports he has lived a wonderful life much longer than he ever could have imagined.   ROS: Negative unless specifically indicated above in HPI.   Relevant past medical history reviewed and updated as indicated.   Allergies and medications reviewed and updated.   Current Outpatient Medications:  .  amLODipine (NORVASC) 10 MG tablet, Take 1 tablet (10 mg total) by mouth daily., Disp: 90 tablet, Rfl: 3 .  Cyanocobalamin (VITAMIN B-12 PO), Take 1 tablet by mouth daily., Disp: , Rfl:  .  Pediatric Multivitamins-Iron (FLINTSTONES PLUS IRON) chewable tablet, Chew 1 tablet by mouth 2 (two) times daily., Disp: , Rfl:   Allergies  Allergen Reactions  . Pineapple Nausea And Vomiting  . Tape Itching and Other (See Comments)    *Paper tape to be used only- please*    Objective:  BP 134/67   Pulse 71   Temp (!) 97.3 F (36.3 C) (Temporal)   Ht 6\' 1"  (1.854 m)   Wt 126 lb 9.6 oz (57.4 kg)   SpO2 99%   BMI 16.70 kg/m    Physical Exam Vitals reviewed.  Constitutional:      General: He is not in acute distress.    Appearance: Normal appearance. He is underweight. He is not ill-appearing, toxic-appearing or diaphoretic.  HENT:     Head: Normocephalic and atraumatic.  Eyes:     General: No scleral icterus.       Right eye: No discharge.        Left eye: No discharge.     Conjunctiva/sclera: Conjunctivae normal.  Cardiovascular:     Rate and Rhythm:  Normal rate and regular rhythm.     Heart sounds: Normal heart sounds. No murmur heard.  No friction rub. No gallop.   Pulmonary:     Effort: Pulmonary effort is normal. No respiratory distress.     Breath sounds: Normal breath sounds. No stridor. No wheezing, rhonchi or rales.  Musculoskeletal:        General: Normal range of motion.     Cervical back: Normal range of motion.  Skin:    General: Skin is warm and dry.  Neurological:     Mental Status: He is alert and oriented to person, place, and time. Mental status is at baseline.  Psychiatric:        Mood and Affect: Mood normal.        Behavior: Behavior normal.        Thought Content: Thought content normal.        Judgment: Judgment normal.

## 2019-09-02 ENCOUNTER — Encounter: Payer: Self-pay | Admitting: Family Medicine

## 2019-09-02 DIAGNOSIS — R634 Abnormal weight loss: Secondary | ICD-10-CM | POA: Insufficient documentation

## 2019-09-02 DIAGNOSIS — K118 Other diseases of salivary glands: Secondary | ICD-10-CM | POA: Insufficient documentation

## 2019-09-02 DIAGNOSIS — R918 Other nonspecific abnormal finding of lung field: Secondary | ICD-10-CM | POA: Insufficient documentation

## 2019-09-05 ENCOUNTER — Other Ambulatory Visit: Payer: Self-pay | Admitting: Family Medicine

## 2019-09-05 ENCOUNTER — Telehealth: Payer: Self-pay | Admitting: Family Medicine

## 2019-09-05 DIAGNOSIS — C3490 Malignant neoplasm of unspecified part of unspecified bronchus or lung: Secondary | ICD-10-CM

## 2019-09-05 NOTE — Telephone Encounter (Signed)
Placed.  See Thomas Davenport's note

## 2019-09-05 NOTE — Telephone Encounter (Signed)
°  REFERRAL REQUEST Telephone Note 09/05/2019  What type of referral do you need? Hospice  Have you been seen at our office for this problem? Yes, advised to call back if husband agreed to referral (Advise that they may need an appointment with their PCP before a referral can be done)  Is there a particular doctor or location that you prefer? no  Patient notified that referrals can take up to a week or longer to process. If they haven't heard anything within a week they should call back and speak with the referral department.

## 2019-09-07 ENCOUNTER — Telehealth: Payer: Self-pay | Admitting: Family Medicine

## 2019-09-07 DIAGNOSIS — R918 Other nonspecific abnormal finding of lung field: Secondary | ICD-10-CM

## 2019-09-07 DIAGNOSIS — K118 Other diseases of salivary glands: Secondary | ICD-10-CM

## 2019-09-07 NOTE — Telephone Encounter (Signed)
I have discussed with daughter that a referral to oncology is most appropriate so that they can order repeat PET scan and discuss prognosis. Referral order placed.

## 2019-09-07 NOTE — Telephone Encounter (Signed)
Please advise 

## 2019-09-12 ENCOUNTER — Telehealth: Payer: Self-pay | Admitting: Family Medicine

## 2019-09-13 NOTE — Telephone Encounter (Signed)
Faxed referral over to [facility] in [place] - called and spoke to April (daughter) patient has appointment today 09/13/2019 at 1 pm

## 2019-09-14 ENCOUNTER — Telehealth: Payer: Self-pay | Admitting: *Deleted

## 2019-09-14 DIAGNOSIS — R0602 Shortness of breath: Secondary | ICD-10-CM | POA: Diagnosis not present

## 2019-09-14 DIAGNOSIS — K5900 Constipation, unspecified: Secondary | ICD-10-CM | POA: Diagnosis not present

## 2019-09-14 DIAGNOSIS — R531 Weakness: Secondary | ICD-10-CM | POA: Diagnosis not present

## 2019-09-14 DIAGNOSIS — K219 Gastro-esophageal reflux disease without esophagitis: Secondary | ICD-10-CM | POA: Diagnosis not present

## 2019-09-14 DIAGNOSIS — R52 Pain, unspecified: Secondary | ICD-10-CM | POA: Diagnosis not present

## 2019-09-14 DIAGNOSIS — C3490 Malignant neoplasm of unspecified part of unspecified bronchus or lung: Secondary | ICD-10-CM | POA: Diagnosis not present

## 2019-09-14 DIAGNOSIS — I10 Essential (primary) hypertension: Secondary | ICD-10-CM | POA: Diagnosis not present

## 2019-09-14 DIAGNOSIS — R42 Dizziness and giddiness: Secondary | ICD-10-CM | POA: Diagnosis not present

## 2019-09-14 NOTE — Telephone Encounter (Signed)
Returned call and left detailed message with response.

## 2019-09-14 NOTE — Telephone Encounter (Signed)
Patient was admitted to [facility] through personal referral.  Pia Mau Nurse intake with Hospice call requesting that Dr. Lajuana Ripple be patients attending physician while on hospice care.

## 2019-09-14 NOTE — Telephone Encounter (Signed)
yes

## 2019-09-15 ENCOUNTER — Other Ambulatory Visit: Payer: Self-pay | Admitting: Family Medicine

## 2019-09-15 ENCOUNTER — Telehealth: Payer: Self-pay

## 2019-09-15 DIAGNOSIS — G893 Neoplasm related pain (acute) (chronic): Secondary | ICD-10-CM

## 2019-09-15 DIAGNOSIS — K219 Gastro-esophageal reflux disease without esophagitis: Secondary | ICD-10-CM | POA: Diagnosis not present

## 2019-09-15 DIAGNOSIS — R531 Weakness: Secondary | ICD-10-CM | POA: Diagnosis not present

## 2019-09-15 DIAGNOSIS — C7951 Secondary malignant neoplasm of bone: Secondary | ICD-10-CM

## 2019-09-15 DIAGNOSIS — I10 Essential (primary) hypertension: Secondary | ICD-10-CM | POA: Diagnosis not present

## 2019-09-15 DIAGNOSIS — R42 Dizziness and giddiness: Secondary | ICD-10-CM | POA: Diagnosis not present

## 2019-09-15 DIAGNOSIS — K5900 Constipation, unspecified: Secondary | ICD-10-CM | POA: Diagnosis not present

## 2019-09-15 DIAGNOSIS — Z515 Encounter for palliative care: Secondary | ICD-10-CM

## 2019-09-15 DIAGNOSIS — C3490 Malignant neoplasm of unspecified part of unspecified bronchus or lung: Secondary | ICD-10-CM | POA: Diagnosis not present

## 2019-09-15 MED ORDER — ONDANSETRON 4 MG PO TBDP
4.0000 mg | ORAL_TABLET | Freq: Three times a day (TID) | ORAL | 1 refills | Status: AC | PRN
Start: 2019-09-15 — End: ?

## 2019-09-15 MED ORDER — SENNOSIDES-DOCUSATE SODIUM 8.6-50 MG PO TABS: 1.0000 | ORAL_TABLET | Freq: Two times a day (BID) | ORAL | 0 refills | Status: AC

## 2019-09-15 MED ORDER — HYDROCODONE-ACETAMINOPHEN 5-325 MG PO TABS: 1.0000 | ORAL_TABLET | Freq: Four times a day (QID) | ORAL | 0 refills | Status: AC | PRN

## 2019-09-15 NOTE — Telephone Encounter (Signed)
Beth from [facility] called to let us know that patient was admitted to their services yesterday for probable lung cancer and to ask if we could prescribe medications for him.  She reports he is having pain in his bones and the concern is that it has spread to his bones.  She is requesting that you send in Hydrocodone 5/325, 1 every 6 hours and also Senna-S, 1 tablet bid.  This can be faxed to [facility] at [phone].  Please let her know if this is possible.

## 2019-09-15 NOTE — Telephone Encounter (Signed)
Thomas Davenport with Hospice aware that medications have been sent in.  Thomas Davenport, nurse who went out to see patient this morning is reporting nausea.  Thomas Davenport with [facility] is asking if we will prescribe Zofran 4 mg, one every 8 hours prn, to Thomas Davenport.  Please advise.

## 2019-09-15 NOTE — Telephone Encounter (Signed)
Done

## 2019-09-15 NOTE — Telephone Encounter (Signed)
The Narcotic Database has been reviewed.  There were no red flags.    Meds sent

## 2019-09-18 DIAGNOSIS — R42 Dizziness and giddiness: Secondary | ICD-10-CM | POA: Diagnosis not present

## 2019-09-18 DIAGNOSIS — R531 Weakness: Secondary | ICD-10-CM | POA: Diagnosis not present

## 2019-09-18 DIAGNOSIS — K219 Gastro-esophageal reflux disease without esophagitis: Secondary | ICD-10-CM | POA: Diagnosis not present

## 2019-09-18 DIAGNOSIS — C3490 Malignant neoplasm of unspecified part of unspecified bronchus or lung: Secondary | ICD-10-CM | POA: Diagnosis not present

## 2019-09-18 DIAGNOSIS — I10 Essential (primary) hypertension: Secondary | ICD-10-CM | POA: Diagnosis not present

## 2019-09-18 DIAGNOSIS — K5900 Constipation, unspecified: Secondary | ICD-10-CM | POA: Diagnosis not present

## 2019-09-19 DIAGNOSIS — C3490 Malignant neoplasm of unspecified part of unspecified bronchus or lung: Secondary | ICD-10-CM | POA: Diagnosis not present

## 2019-09-19 DIAGNOSIS — R531 Weakness: Secondary | ICD-10-CM | POA: Diagnosis not present

## 2019-09-19 DIAGNOSIS — R42 Dizziness and giddiness: Secondary | ICD-10-CM | POA: Diagnosis not present

## 2019-09-19 DIAGNOSIS — K5900 Constipation, unspecified: Secondary | ICD-10-CM | POA: Diagnosis not present

## 2019-09-19 DIAGNOSIS — K219 Gastro-esophageal reflux disease without esophagitis: Secondary | ICD-10-CM | POA: Diagnosis not present

## 2019-09-19 DIAGNOSIS — I10 Essential (primary) hypertension: Secondary | ICD-10-CM | POA: Diagnosis not present

## 2019-09-20 DIAGNOSIS — R531 Weakness: Secondary | ICD-10-CM | POA: Diagnosis not present

## 2019-09-20 DIAGNOSIS — K219 Gastro-esophageal reflux disease without esophagitis: Secondary | ICD-10-CM | POA: Diagnosis not present

## 2019-09-20 DIAGNOSIS — K5900 Constipation, unspecified: Secondary | ICD-10-CM | POA: Diagnosis not present

## 2019-09-20 DIAGNOSIS — I10 Essential (primary) hypertension: Secondary | ICD-10-CM | POA: Diagnosis not present

## 2019-09-20 DIAGNOSIS — R42 Dizziness and giddiness: Secondary | ICD-10-CM | POA: Diagnosis not present

## 2019-09-20 DIAGNOSIS — C3490 Malignant neoplasm of unspecified part of unspecified bronchus or lung: Secondary | ICD-10-CM | POA: Diagnosis not present

## 2019-09-21 DIAGNOSIS — R0602 Shortness of breath: Secondary | ICD-10-CM | POA: Diagnosis not present

## 2019-09-21 DIAGNOSIS — C3490 Malignant neoplasm of unspecified part of unspecified bronchus or lung: Secondary | ICD-10-CM | POA: Diagnosis not present

## 2019-09-21 DIAGNOSIS — K219 Gastro-esophageal reflux disease without esophagitis: Secondary | ICD-10-CM | POA: Diagnosis not present

## 2019-09-21 DIAGNOSIS — I10 Essential (primary) hypertension: Secondary | ICD-10-CM | POA: Diagnosis not present

## 2019-09-21 DIAGNOSIS — R52 Pain, unspecified: Secondary | ICD-10-CM | POA: Diagnosis not present

## 2019-09-21 DIAGNOSIS — K5900 Constipation, unspecified: Secondary | ICD-10-CM | POA: Diagnosis not present

## 2019-09-21 DIAGNOSIS — R531 Weakness: Secondary | ICD-10-CM | POA: Diagnosis not present

## 2019-09-21 DIAGNOSIS — R42 Dizziness and giddiness: Secondary | ICD-10-CM | POA: Diagnosis not present

## 2019-09-26 ENCOUNTER — Telehealth: Payer: Self-pay | Admitting: *Deleted

## 2019-09-26 DIAGNOSIS — R42 Dizziness and giddiness: Secondary | ICD-10-CM | POA: Diagnosis not present

## 2019-09-26 DIAGNOSIS — R531 Weakness: Secondary | ICD-10-CM | POA: Diagnosis not present

## 2019-09-26 DIAGNOSIS — C3490 Malignant neoplasm of unspecified part of unspecified bronchus or lung: Secondary | ICD-10-CM | POA: Diagnosis not present

## 2019-09-26 DIAGNOSIS — I10 Essential (primary) hypertension: Secondary | ICD-10-CM | POA: Diagnosis not present

## 2019-09-26 DIAGNOSIS — K219 Gastro-esophageal reflux disease without esophagitis: Secondary | ICD-10-CM | POA: Diagnosis not present

## 2019-09-26 DIAGNOSIS — K5900 Constipation, unspecified: Secondary | ICD-10-CM | POA: Diagnosis not present

## 2019-09-26 NOTE — Telephone Encounter (Signed)
Thanks for the update. I think he's technically seeing Britney joyce.  I will cc her on this.

## 2019-09-26 NOTE — Telephone Encounter (Signed)
Vitamin B12 and Flinstones were denied-Medicare has excluded OTC drug products from Part D coverage

## 2019-09-26 NOTE — Telephone Encounter (Signed)
PA for Amlodipine Besylate 10mg -no PA needed  Available to you at the amount listed in your plan documents  Pt aware.

## 2019-09-27 ENCOUNTER — Telehealth: Payer: Self-pay | Admitting: *Deleted

## 2019-09-27 NOTE — Telephone Encounter (Signed)
[place] from [facility] w/ Hospice Pt's BP has been 96/50 & 101/50 Would it be ok to hold Amlodipine for next 2 days then recheck Please advise

## 2019-09-27 NOTE — Telephone Encounter (Signed)
Yes ma'am! 

## 2019-09-28 DIAGNOSIS — K219 Gastro-esophageal reflux disease without esophagitis: Secondary | ICD-10-CM | POA: Diagnosis not present

## 2019-09-28 DIAGNOSIS — I10 Essential (primary) hypertension: Secondary | ICD-10-CM | POA: Diagnosis not present

## 2019-09-28 DIAGNOSIS — K5900 Constipation, unspecified: Secondary | ICD-10-CM | POA: Diagnosis not present

## 2019-09-28 DIAGNOSIS — C3490 Malignant neoplasm of unspecified part of unspecified bronchus or lung: Secondary | ICD-10-CM | POA: Diagnosis not present

## 2019-09-28 DIAGNOSIS — R42 Dizziness and giddiness: Secondary | ICD-10-CM | POA: Diagnosis not present

## 2019-09-28 DIAGNOSIS — R531 Weakness: Secondary | ICD-10-CM | POA: Diagnosis not present

## 2019-09-28 NOTE — Telephone Encounter (Signed)
Hospice will monitor for low blood pressure and hold amlodipine .

## 2019-09-29 DIAGNOSIS — K5900 Constipation, unspecified: Secondary | ICD-10-CM | POA: Diagnosis not present

## 2019-09-29 DIAGNOSIS — K219 Gastro-esophageal reflux disease without esophagitis: Secondary | ICD-10-CM | POA: Diagnosis not present

## 2019-09-29 DIAGNOSIS — R42 Dizziness and giddiness: Secondary | ICD-10-CM | POA: Diagnosis not present

## 2019-09-29 DIAGNOSIS — C3490 Malignant neoplasm of unspecified part of unspecified bronchus or lung: Secondary | ICD-10-CM | POA: Diagnosis not present

## 2019-09-29 DIAGNOSIS — R531 Weakness: Secondary | ICD-10-CM | POA: Diagnosis not present

## 2019-09-29 DIAGNOSIS — I10 Essential (primary) hypertension: Secondary | ICD-10-CM | POA: Diagnosis not present

## 2019-10-02 DIAGNOSIS — K219 Gastro-esophageal reflux disease without esophagitis: Secondary | ICD-10-CM | POA: Diagnosis not present

## 2019-10-02 DIAGNOSIS — R531 Weakness: Secondary | ICD-10-CM | POA: Diagnosis not present

## 2019-10-02 DIAGNOSIS — R42 Dizziness and giddiness: Secondary | ICD-10-CM | POA: Diagnosis not present

## 2019-10-02 DIAGNOSIS — I10 Essential (primary) hypertension: Secondary | ICD-10-CM | POA: Diagnosis not present

## 2019-10-02 DIAGNOSIS — K5900 Constipation, unspecified: Secondary | ICD-10-CM | POA: Diagnosis not present

## 2019-10-02 DIAGNOSIS — C3490 Malignant neoplasm of unspecified part of unspecified bronchus or lung: Secondary | ICD-10-CM | POA: Diagnosis not present

## 2019-10-03 DIAGNOSIS — C3490 Malignant neoplasm of unspecified part of unspecified bronchus or lung: Secondary | ICD-10-CM | POA: Diagnosis not present

## 2019-10-03 DIAGNOSIS — K219 Gastro-esophageal reflux disease without esophagitis: Secondary | ICD-10-CM | POA: Diagnosis not present

## 2019-10-03 DIAGNOSIS — R42 Dizziness and giddiness: Secondary | ICD-10-CM | POA: Diagnosis not present

## 2019-10-03 DIAGNOSIS — I10 Essential (primary) hypertension: Secondary | ICD-10-CM | POA: Diagnosis not present

## 2019-10-03 DIAGNOSIS — K5900 Constipation, unspecified: Secondary | ICD-10-CM | POA: Diagnosis not present

## 2019-10-03 DIAGNOSIS — R531 Weakness: Secondary | ICD-10-CM | POA: Diagnosis not present

## 2019-10-04 ENCOUNTER — Telehealth: Payer: Self-pay | Admitting: Family Medicine

## 2019-10-04 NOTE — Telephone Encounter (Signed)
Hospice called to let PCP know that patient passed away yesterday (11/01/19) at his home.

## 2019-10-22 DEATH — deceased

## 2020-01-02 ENCOUNTER — Ambulatory Visit: Payer: Medicare Other | Admitting: Family Medicine

## 2020-09-24 IMAGING — MR MR HEAD WO/W CM
10 of 14 series · 31 of 48 positions shown · IV contrast (gadavist)
Comparison: None.

CLINICAL DATA: Vision changes

EXAM:
MRI HEAD WITHOUT AND WITH CONTRAST
MRA HEAD WITHOUT CONTRAST
MRA NECK WITHOUT AND WITH CONTRAST
TECHNIQUE: Multiplanar, multiecho pulse sequences of the brain and surrounding
structures were obtained without and with intravenous contrast.
Angiographic images of the Circle of Willis were obtained using MRA
technique without intravenous contrast. Angiographic images of the
neck were obtained using MRA technique without and with intravenous
contrast. Carotid stenosis measurements (when applicable) are
obtained utilizing NASCET criteria, using the distal internal
carotid diameter as the denominator.
CONTRAST:  7mL GADAVIST GADOBUTROL 1 MMOL/ML IV SOLN

[Series 2: T1 · sagittal · 5.0mm · 0.43mm/px · 1 of 20 slices shown]
[im 1/20]
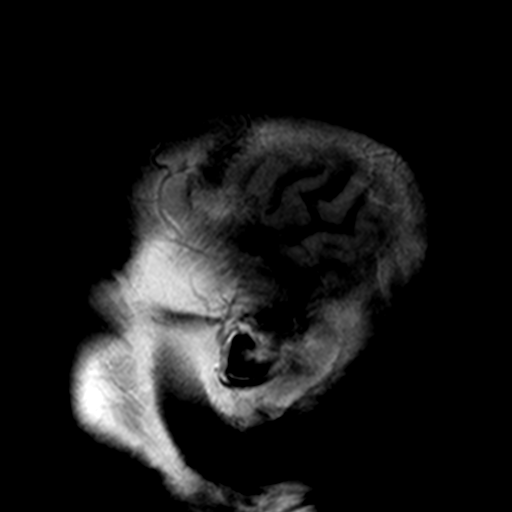

[Series 3: DWI · axial · 3.0mm · 0.77mm/px · z∈[-80,+80]mm · 5 of 55 slices shown (1 of 2)]
[im 1/55]
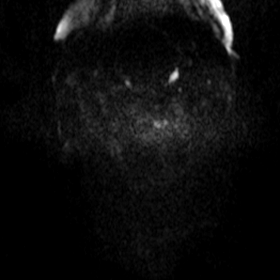
[im 14/55]
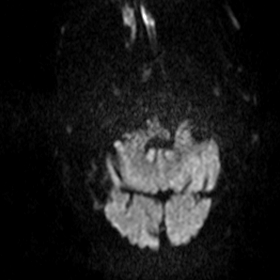
[im 28/55]
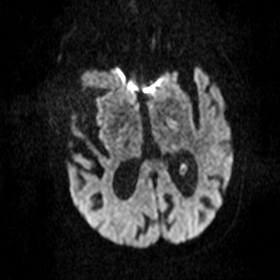
[im 41/55]
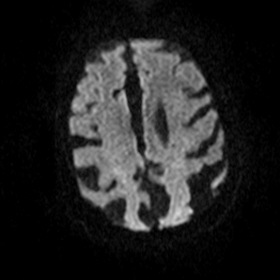
[im 55/55]
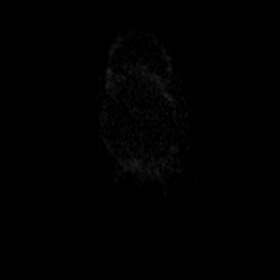

[Series 5: DWI · coronal · 5.0mm · 0.49mm/px · 3 of 34 slices shown (2 of 2)]
[im 1/34]
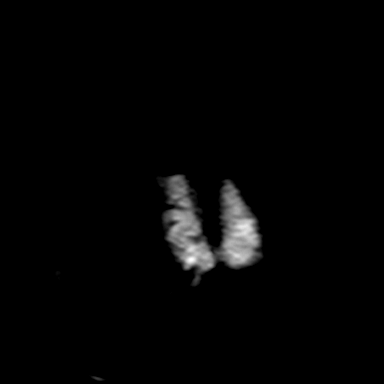
[im 17/34]
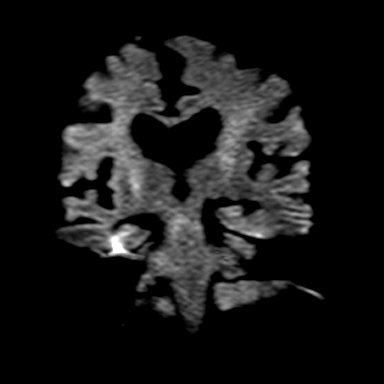
[im 34/34]
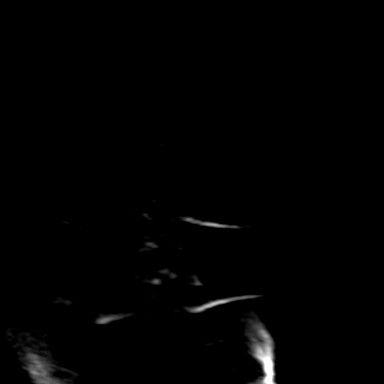

[Series 7: T2 · axial · 5.0mm · 0.68mm/px · z∈[-69,+72]mm · 2 of 23 slices shown (1 of 4)]
[im 1/23]
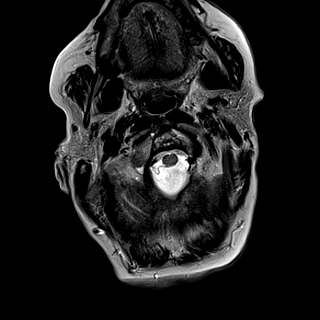
[im 23/23]
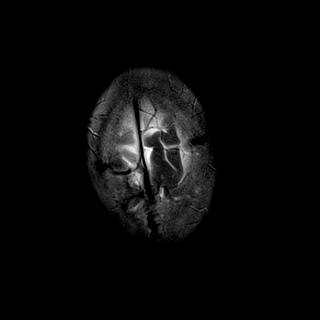

[Series 8: T2 · axial · 5.0mm · 0.43mm/px · z∈[-63,+65]mm · 2 of 21 slices shown (2 of 4)]
[im 1/21]
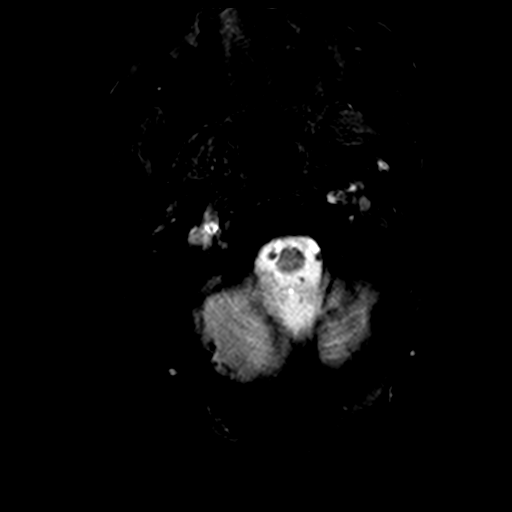
[im 21/21]
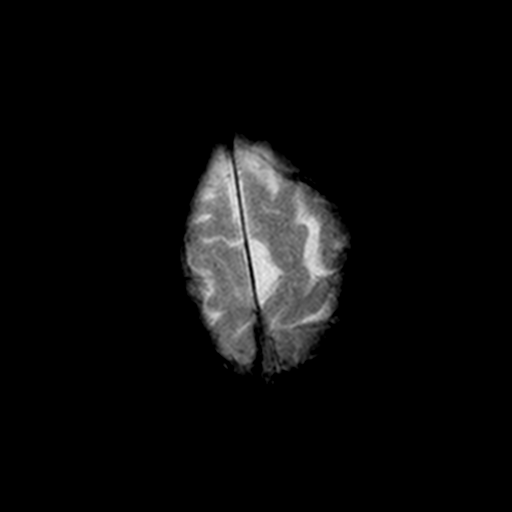

[Series 9: FLAIR · axial · 3.0mm · 0.87mm/px · z∈[-67,+69]mm · 4 of 47 slices shown]
[im 1/47]
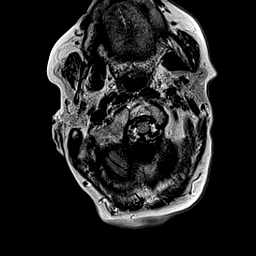
[im 16/47]
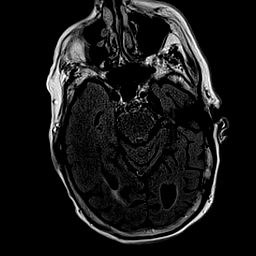
[im 31/47]
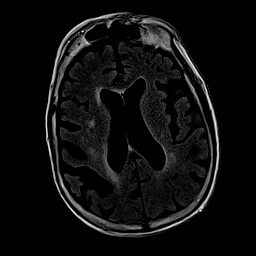
[im 47/47]
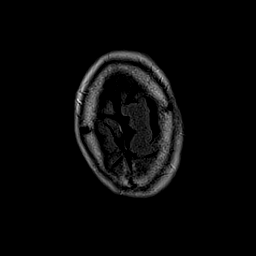

[Series 11: T2 · coronal · 5.0mm · 0.65mm/px · 2 of 28 slices shown (3 of 4)]
[im 1/28]
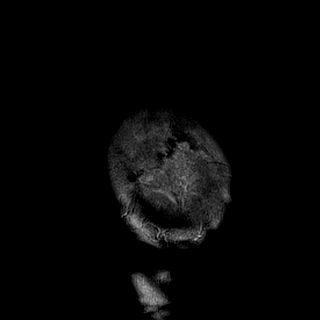
[im 28/28]
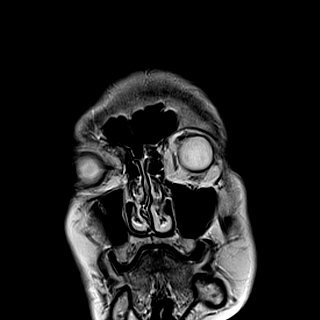

[Series 12: T2 · coronal · 5.0mm · 0.68mm/px · 2 of 28 slices shown (4 of 4)]
[im 1/28]
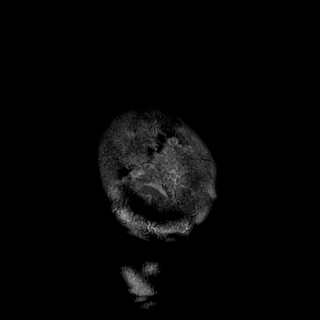
[im 28/28]
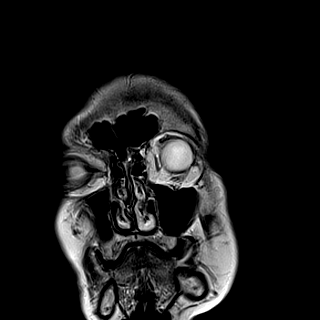

[Series 13: T1 post-contrast · axial · 2.0mm · 0.47mm/px · z∈[-100,+83]mm · 8 of 93 slices shown (1 of 2)]
[im 1/93]
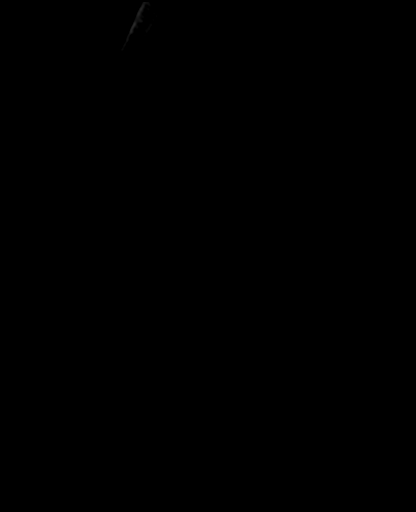
[im 14/93]
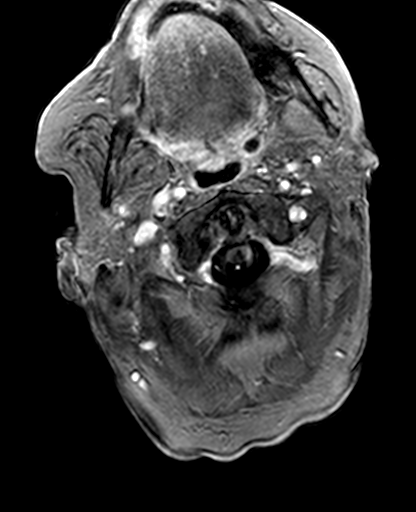
[im 27/93]
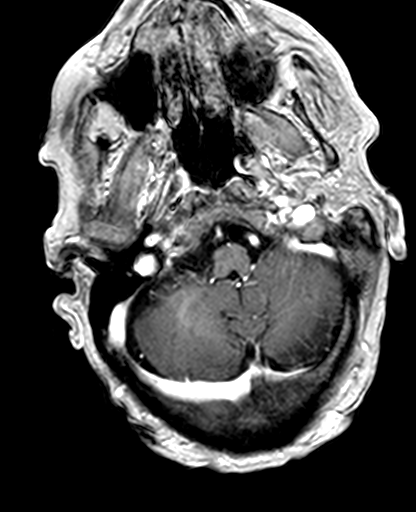
[im 40/93]
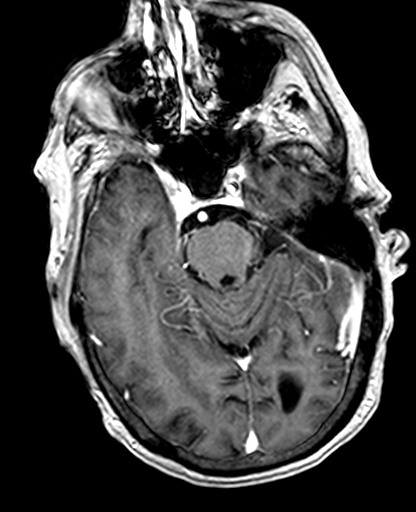
[im 53/93]
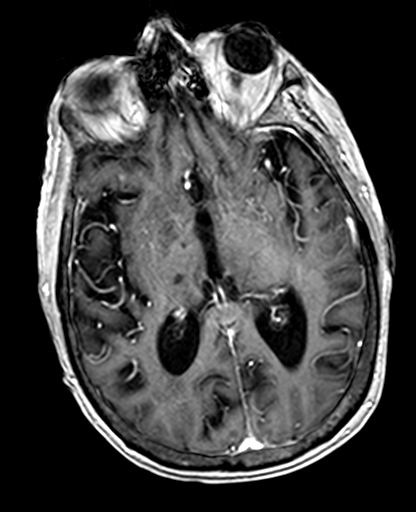
[im 66/93]
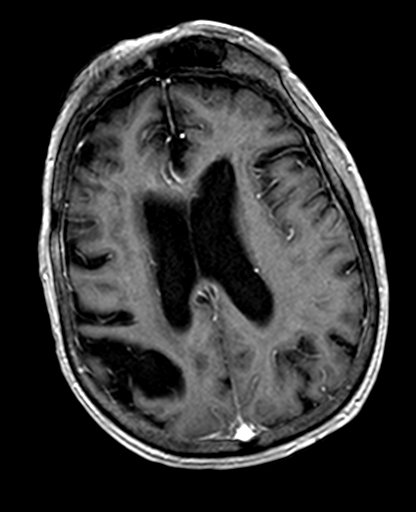
[im 79/93]
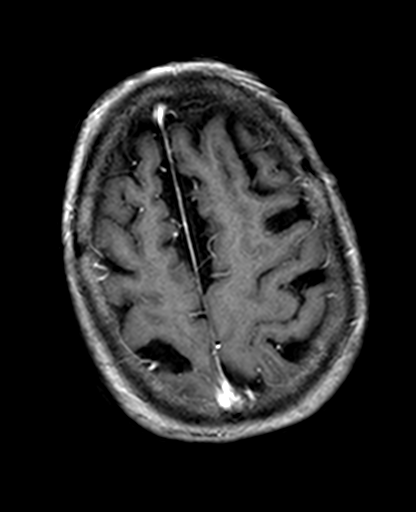
[im 93/93]
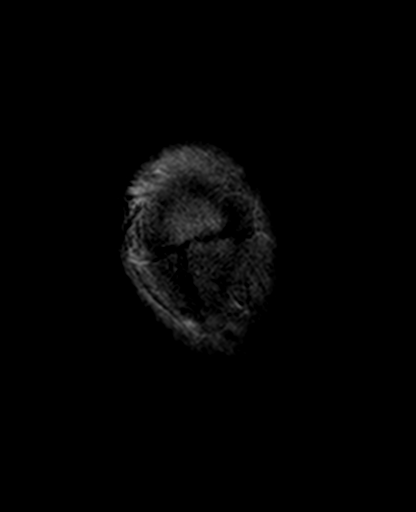

[Series 14: T1 post-contrast · coronal · 5.0mm · 0.44mm/px · 2 of 24 slices shown (2 of 2)]
[im 1/24]
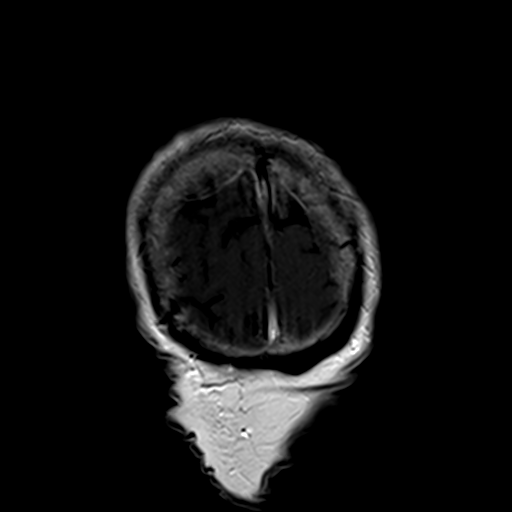
[im 24/24]
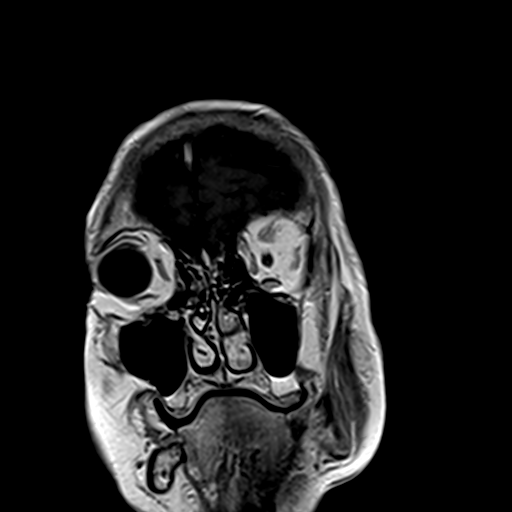

[31 of 48 positions shown; findings below may reference images not displayed]

FINDINGS: MRI HEAD FINDINGS

Brain: There is no acute infarction or intracranial hemorrhage.
There is no intracranial mass, mass effect, edema, or extra-axial
fluid collection. Prominence of the ventricles and sulci reflects
parenchymal volume loss. Patchy and confluent areas of T2
hyperintensity in the supratentorial white matter are non-specific
but probably reflect moderate chronic microvascular ischemic
changes. There are chronic small vessel infarcts of the basal
ganglia and thalamus.

Vascular: Major vessel flow voids at the skull base are preserved.

Skull and upper cervical spine: Marrow signal is within normal
limits.

Sinuses/Orbits: Minor paranasal sinus mucosal thickening. Bilateral
lens replacements.

Other: None.

MRA HEAD FINDINGS

Intracranial internal carotid arteries are patent with
atherosclerotic irregularity along the cavernous segments. There is
a patent left posterior communicating artery. Middle and anterior
cerebral arteries are patent. An anterior communicating artery is
present. Intracranial vertebral, basilar, and posterior cerebral
arteries are patent. There is no significant stenosis or aneurysm.

MRA NECK FINDINGS

Common carotid, internal carotid, external carotid arteries are
patent. Plaque at the right ICA origin causes less than 50%
stenosis. Plaque at the left ICA origin causes approximately 50%
stenosis.

Extracranial vertebral arteries are patent. The left vertebral
artery is dominant. There is stenosis at the origins, at least
moderate.
IMPRESSION: No evidence of recent infarction, intracranial hemorrhage, or mass.

Moderate chronic microvascular ischemic changes. Chronic small
vessel infarct.

No proximal intracranial vessel occlusion.

Atherosclerotic plaque at the ICA origins causing less than 50%
stenosis on the right and approximately 50% stenosis on the left.

## 2020-09-24 IMAGING — MR MR MRA NECK WO/W CM
3 series · 20 of 48 positions shown · IV contrast (gadavist)
Comparison: None.

CLINICAL DATA: Vision changes

EXAM:
MRI HEAD WITHOUT AND WITH CONTRAST
MRA HEAD WITHOUT CONTRAST
MRA NECK WITHOUT AND WITH CONTRAST
TECHNIQUE: Multiplanar, multiecho pulse sequences of the brain and surrounding
structures were obtained without and with intravenous contrast.
Angiographic images of the Circle of Willis were obtained using MRA
technique without intravenous contrast. Angiographic images of the
neck were obtained using MRA technique without and with intravenous
contrast. Carotid stenosis measurements (when applicable) are
obtained utilizing NASCET criteria, using the distal internal
carotid diameter as the denominator.
CONTRAST:  7mL GADAVIST GADOBUTROL 1 MMOL/ML IV SOLN

[Series 4: TOF · axial · 1.0mm · 0.42mm/px · z∈[-211,-123]mm · 10 of 104 slices shown]
[im 7/104]
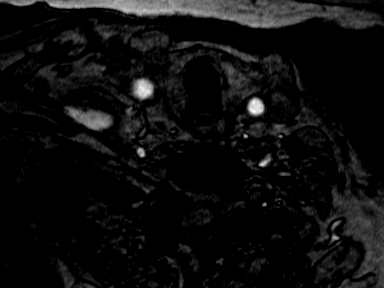
[im 19/104]
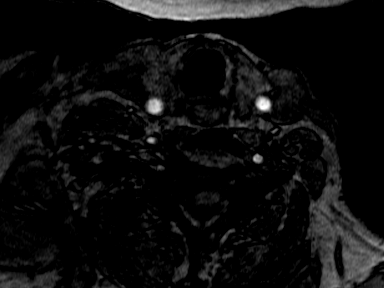
[im 31/104]
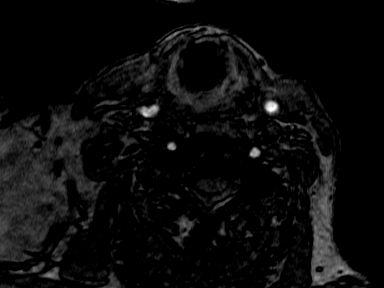
[im 43/104]
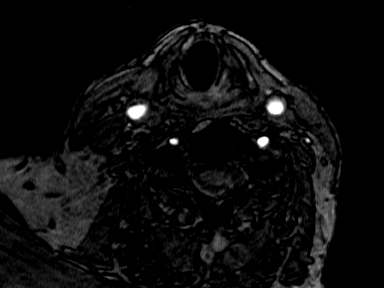
[im 55/104]
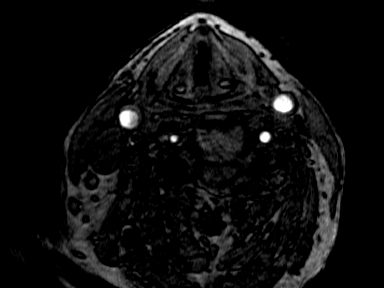
[im 61/104]
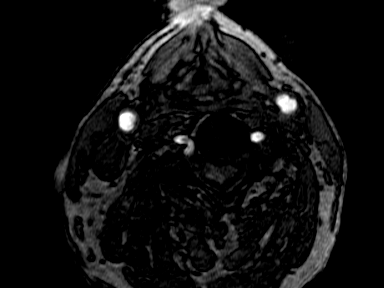
[im 73/104]
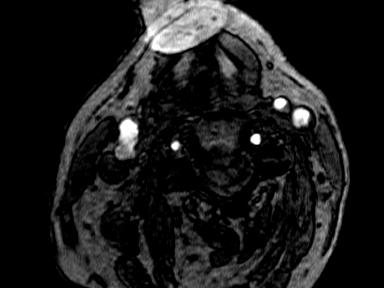
[im 85/104]
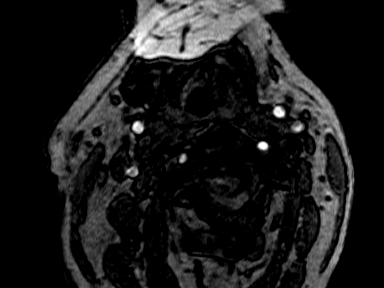
[im 91/104]
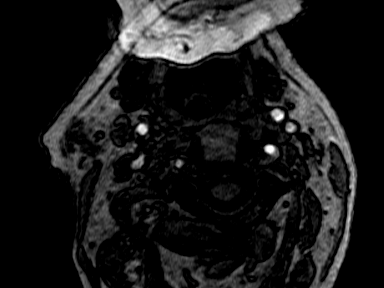
[im 97/104]
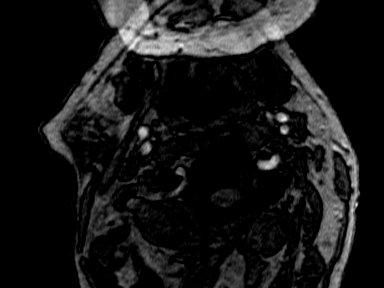

[Series 7: neck mask · coronal · 1.2mm · 0.41mm/px · 7 of 80 slices shown]
[im 1/80]
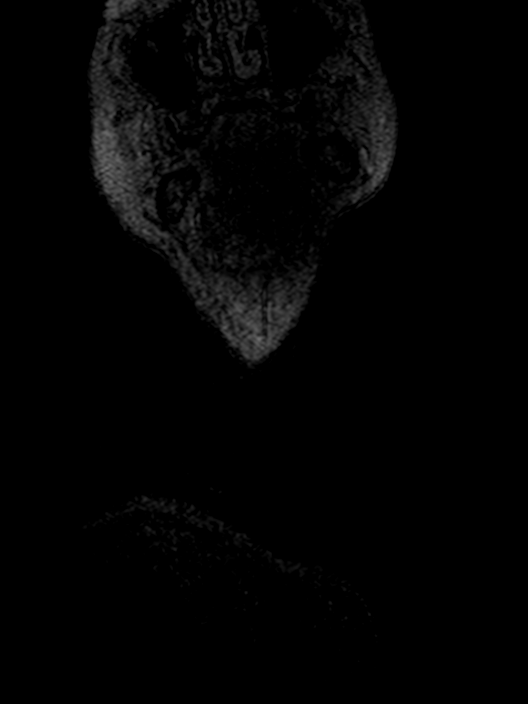
[im 12/80]
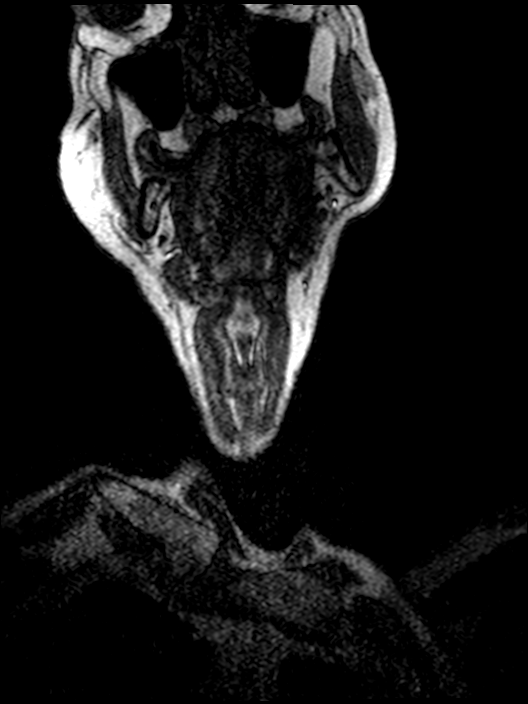
[im 23/80]
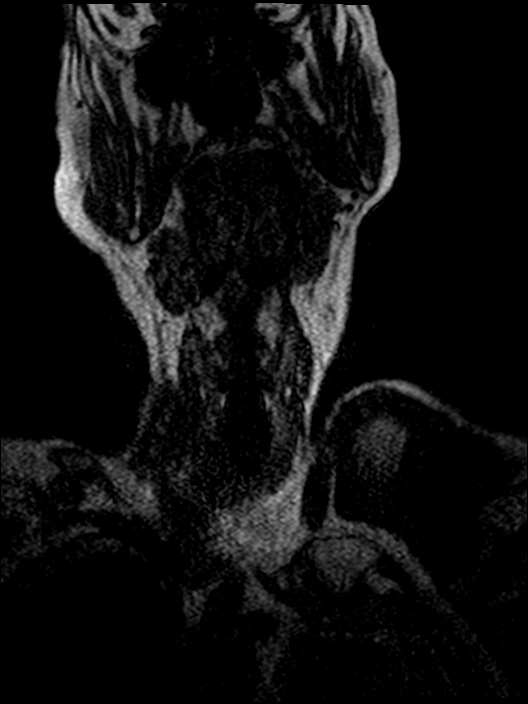
[im 34/80]
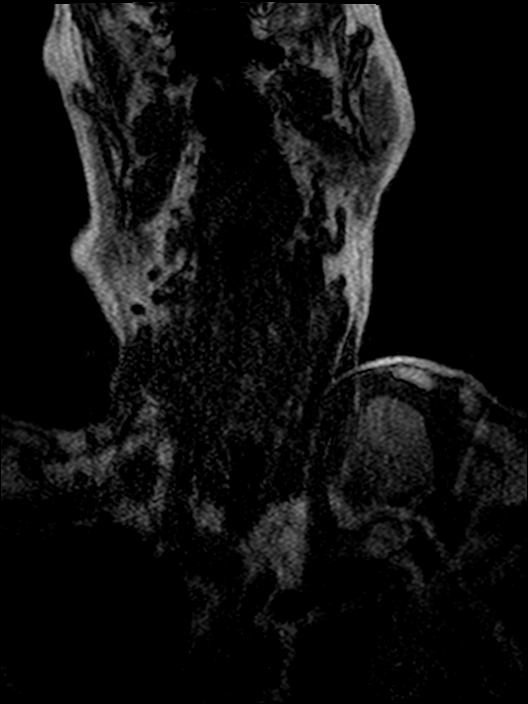
[im 40/80]
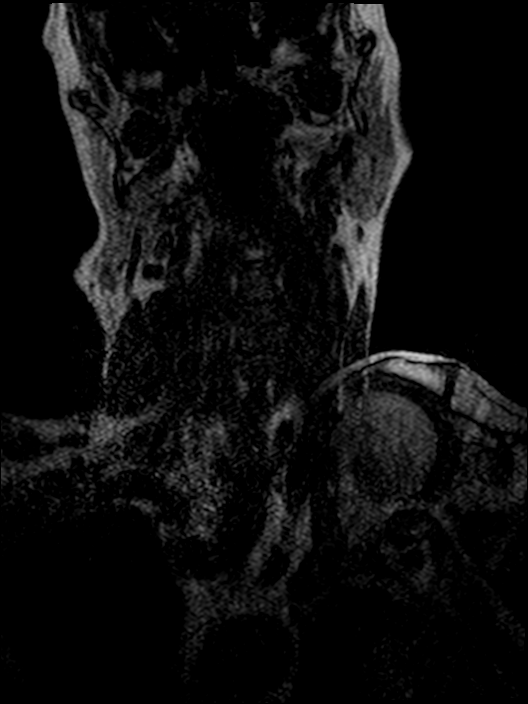
[im 46/80]
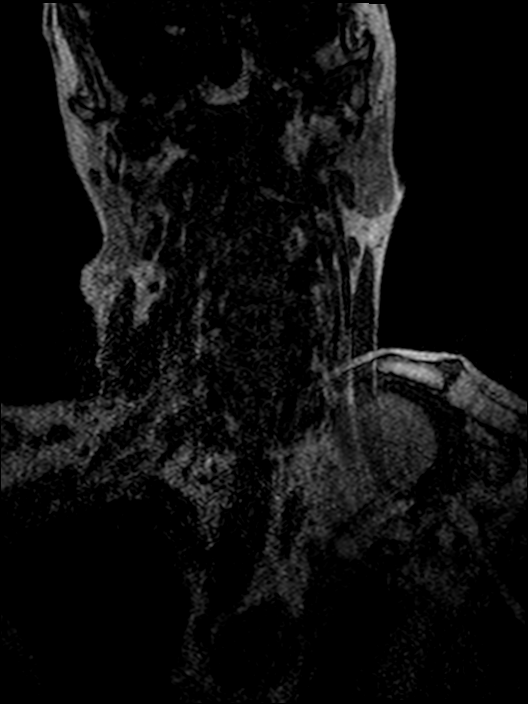
[im 68/80]
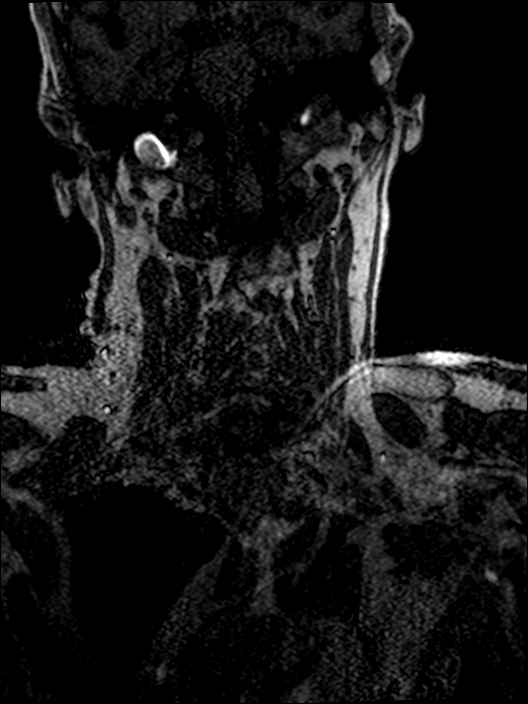

[Series 9: MRA · coronal · 1.2mm · 0.41mm/px · 3 of 80 slices shown]
[im 12/80]
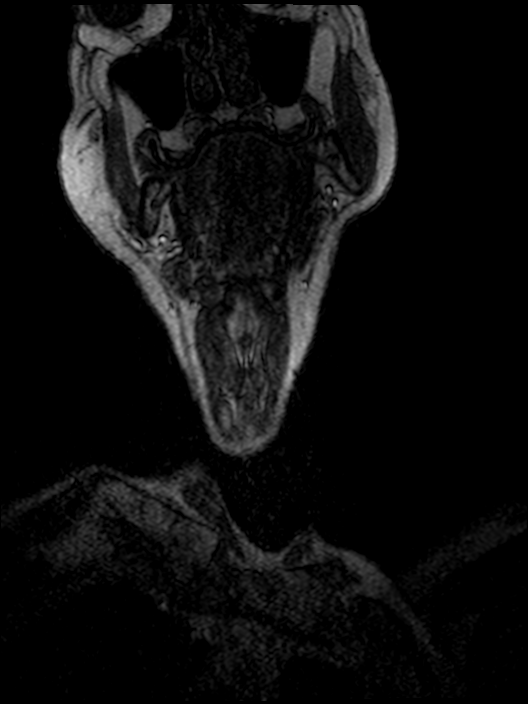
[im 40/80]
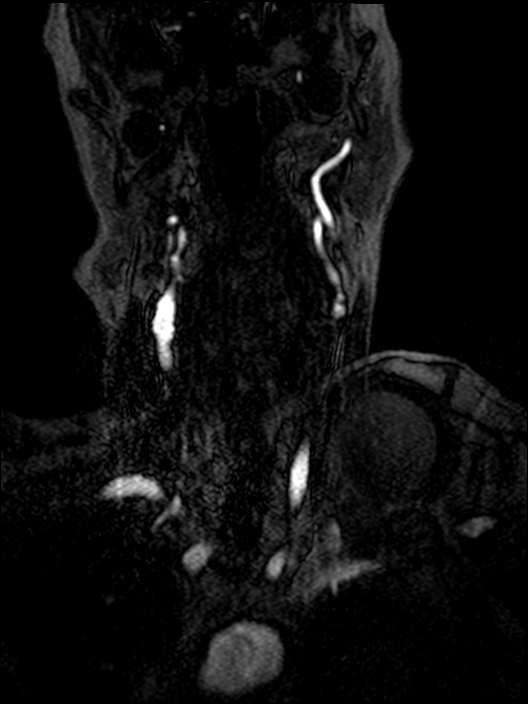
[im 68/80]
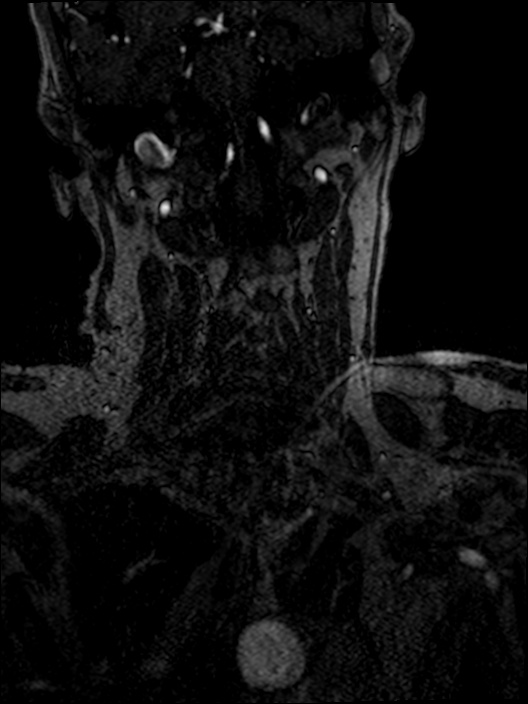

[20 of 48 positions shown; findings below may reference images not displayed]

FINDINGS: MRI HEAD FINDINGS

Brain: There is no acute infarction or intracranial hemorrhage.
There is no intracranial mass, mass effect, edema, or extra-axial
fluid collection. Prominence of the ventricles and sulci reflects
parenchymal volume loss. Patchy and confluent areas of T2
hyperintensity in the supratentorial white matter are non-specific
but probably reflect moderate chronic microvascular ischemic
changes. There are chronic small vessel infarcts of the basal
ganglia and thalamus.

Vascular: Major vessel flow voids at the skull base are preserved.

Skull and upper cervical spine: Marrow signal is within normal
limits.

Sinuses/Orbits: Minor paranasal sinus mucosal thickening. Bilateral
lens replacements.

Other: None.

MRA HEAD FINDINGS

Intracranial internal carotid arteries are patent with
atherosclerotic irregularity along the cavernous segments. There is
a patent left posterior communicating artery. Middle and anterior
cerebral arteries are patent. An anterior communicating artery is
present. Intracranial vertebral, basilar, and posterior cerebral
arteries are patent. There is no significant stenosis or aneurysm.

MRA NECK FINDINGS

Common carotid, internal carotid, external carotid arteries are
patent. Plaque at the right ICA origin causes less than 50%
stenosis. Plaque at the left ICA origin causes approximately 50%
stenosis.

Extracranial vertebral arteries are patent. The left vertebral
artery is dominant. There is stenosis at the origins, at least
moderate.
IMPRESSION: No evidence of recent infarction, intracranial hemorrhage, or mass.

Moderate chronic microvascular ischemic changes. Chronic small
vessel infarct.

No proximal intracranial vessel occlusion.

Atherosclerotic plaque at the ICA origins causing less than 50%
stenosis on the right and approximately 50% stenosis on the left.
